# Patient Record
Sex: Male | Born: 1965 | Race: Black or African American | Hispanic: No | Marital: Single | Smoking: Current every day smoker
Health system: Southern US, Community
[De-identification: ages and names within clinical notes are randomized; demographics above are authoritative.]

## PROBLEM LIST (undated history)

## (undated) DIAGNOSIS — E739 Lactose intolerance, unspecified: Secondary | ICD-10-CM

## (undated) DIAGNOSIS — S2239XA Fracture of one rib, unspecified side, initial encounter for closed fracture: Secondary | ICD-10-CM

## (undated) DIAGNOSIS — K859 Acute pancreatitis without necrosis or infection, unspecified: Secondary | ICD-10-CM

## (undated) DIAGNOSIS — R569 Unspecified convulsions: Secondary | ICD-10-CM

## (undated) HISTORY — PX: HAND SURGERY: SHX662

---

## 2006-01-05 ENCOUNTER — Emergency Department (HOSPITAL_COMMUNITY): Admission: EM | Admit: 2006-01-05 | Discharge: 2006-01-05 | Payer: Self-pay | Admitting: Emergency Medicine

## 2006-01-07 ENCOUNTER — Emergency Department (HOSPITAL_COMMUNITY): Admission: EM | Admit: 2006-01-07 | Discharge: 2006-01-07 | Payer: Self-pay | Admitting: Emergency Medicine

## 2015-06-21 ENCOUNTER — Emergency Department (HOSPITAL_COMMUNITY): Payer: Self-pay

## 2015-06-21 ENCOUNTER — Emergency Department (HOSPITAL_COMMUNITY)
Admission: EM | Admit: 2015-06-21 | Discharge: 2015-06-21 | Disposition: A | Discharge status: Home / Self Care | Payer: Self-pay | Attending: Emergency Medicine | Admitting: Emergency Medicine

## 2015-06-21 ENCOUNTER — Encounter (HOSPITAL_COMMUNITY): Payer: Self-pay | Admitting: Emergency Medicine

## 2015-06-21 DIAGNOSIS — K759 Inflammatory liver disease, unspecified: Secondary | ICD-10-CM | POA: Insufficient documentation

## 2015-06-21 LAB — COMPREHENSIVE METABOLIC PANEL
ALT: 137 U/L — ABNORMAL HIGH (ref 17–63)
ANION GAP: 11 (ref 5–15)
AST: 163 U/L — ABNORMAL HIGH (ref 15–41)
Albumin: 4.2 g/dL (ref 3.5–5.0)
Alkaline Phosphatase: 49 U/L (ref 38–126)
BUN: <5 mg/dL — ABNORMAL LOW (ref 6–20)
CALCIUM: 9.2 mg/dL (ref 8.9–10.3)
CHLORIDE: 103 mmol/L (ref 101–111)
CO2: 26 mmol/L (ref 22–32)
Creatinine, Ser: 1.01 mg/dL (ref 0.61–1.24)
GFR calc non Af Amer: >60 mL/min (ref >60)
Glucose, Bld: 91 mg/dL (ref 65–99)
Potassium: 4 mmol/L (ref 3.5–5.1)
SODIUM: 140 mmol/L (ref 135–145)
Total Bilirubin: 1.3 mg/dL — ABNORMAL HIGH (ref 0.3–1.2)
Total Protein: 7.3 g/dL (ref 6.5–8.1)

## 2015-06-21 LAB — URINALYSIS, ROUTINE W REFLEX MICROSCOPIC
Bilirubin Urine: NEGATIVE
GLUCOSE, UA: NEGATIVE mg/dL
Hgb urine dipstick: NEGATIVE
Ketones, ur: NEGATIVE mg/dL
LEUKOCYTES UA: NEGATIVE
NITRITE: NEGATIVE
PROTEIN: NEGATIVE mg/dL
Specific Gravity, Urine: 1.02 (ref 1.005–1.030)
Urobilinogen, UA: 1 mg/dL (ref 0.0–1.0)
pH: 7 (ref 5.0–8.0)

## 2015-06-21 LAB — CBC WITH DIFFERENTIAL/PLATELET
Basophils Absolute: 0 10*3/uL (ref 0.0–0.1)
Basophils Relative: 1 % (ref 0–1)
EOS ABS: 0 10*3/uL (ref 0.0–0.7)
Eosinophils Relative: 0 % (ref 0–5)
HCT: 40.2 % (ref 39.0–52.0)
HEMOGLOBIN: 13.7 g/dL (ref 13.0–17.0)
LYMPHS ABS: 0.8 10*3/uL (ref 0.7–4.0)
LYMPHS PCT: 25 % (ref 12–46)
MCH: 28.7 pg (ref 26.0–34.0)
MCHC: 34.1 g/dL (ref 30.0–36.0)
MCV: 84.1 fL (ref 78.0–100.0)
Monocytes Absolute: 0.5 10*3/uL (ref 0.1–1.0)
Monocytes Relative: 17 % — ABNORMAL HIGH (ref 3–12)
NEUTROS PCT: 57 % (ref 43–77)
Neutro Abs: 1.8 10*3/uL (ref 1.7–7.7)
Platelets: 208 10*3/uL (ref 150–400)
RBC: 4.78 MIL/uL (ref 4.22–5.81)
RDW: 13.4 % (ref 11.5–15.5)
WBC: 3.1 10*3/uL — AB (ref 4.0–10.5)

## 2015-06-21 LAB — ACETAMINOPHEN LEVEL

## 2015-06-21 LAB — LIPASE, BLOOD: Lipase: 26 U/L (ref 22–51)

## 2015-06-21 MED ORDER — SODIUM CHLORIDE 0.9 % IV BOLUS (SEPSIS)
1000.0000 mL | Freq: Once | INTRAVENOUS | Status: AC
  Administered 2015-06-21: 1000 mL via INTRAVENOUS

## 2015-06-21 MED ORDER — PANTOPRAZOLE SODIUM 40 MG IV SOLR
40.0000 mg | Freq: Once | INTRAVENOUS | Status: AC
  Administered 2015-06-21: 40 mg via INTRAVENOUS
  Filled 2015-06-21: qty 40

## 2015-06-21 MED ORDER — ONDANSETRON HCL 4 MG/2ML IJ SOLN
4.0000 mg | Freq: Once | INTRAMUSCULAR | Status: AC
  Administered 2015-06-21: 4 mg via INTRAVENOUS
  Filled 2015-06-21: qty 2

## 2015-06-21 MED ORDER — ONDANSETRON 4 MG PO TBDP: ORAL_TABLET | ORAL | Status: DC

## 2015-06-21 MED ORDER — OMEPRAZOLE 20 MG PO CPDR: 20.0000 mg | DELAYED_RELEASE_CAPSULE | Freq: Every day | ORAL | Status: DC

## 2015-06-21 NOTE — ED Notes (Signed)
Patient transported to Ultrasound 

## 2015-06-21 NOTE — ED Notes (Signed)
Pt states he has vomiting since last Tuesday and has gotten progressively worse. Pt states he does have a mild discomfort in his upper abd that is worse when vomiting.

## 2015-06-21 NOTE — ED Notes (Signed)
Pt tolerating PO well at this time.  

## 2015-06-21 NOTE — ED Provider Notes (Signed)
CSN: [id number]     Arrival date & time 06/21/15  0901 History   First MD Initiated Contact with Patient 06/21/15 [phone]     Chief Complaint  Patient presents with  . Emesis     (Consider location/radiation/quality/duration/timing/severity/associated sxs/prior Treatment) HPI Comments: Patient presents with nausea and vomiting. He states he's had about a one-week history of nausea vomiting. His emesis is nonbloody and nonbilious. He did have one episode of some blood-tinged emesis yesterday but none since that time. He is having normal bowel movements. His last bowel movement was yesterday. No diarrhea. No fevers. He has some discomfort across his upper abdomen but he feels like it's related to the vomiting. He states prior to this starting, he did drink a lot of beer. He states he doesn't currently drink a lot but he was off work and for 2 days drink a fair amount appear. It started subsequent to that. He's had some similar symptoms though not this bad intermittently, sometimes after he drinks alcohol. He denies abdominal surgeries. He denies any blood in his stool. He denies any urinary symptoms.  Patient is a 49 y.o. male presenting with vomiting.  Emesis Associated symptoms: abdominal pain   Associated symptoms: no arthralgias, no chills, no diarrhea and no headaches     History reviewed. No pertinent past medical history. Past Surgical History  Procedure Laterality Date  . Hand surgery     No family history on file. Social History  Substance Use Topics  . Smoking status: Never Smoker   . Smokeless tobacco: None  . Alcohol Use: Yes     Comment: socailly     Review of Systems  Constitutional: Negative for fever, chills, diaphoresis and fatigue.  HENT: Negative for congestion, rhinorrhea and sneezing.   Eyes: Negative.   Respiratory: Negative for cough, chest tightness and shortness of breath.   Cardiovascular: Negative for chest pain and leg swelling.  Gastrointestinal: Positive  for nausea, vomiting and abdominal pain. Negative for diarrhea and blood in stool.  Genitourinary: Negative for frequency, hematuria, flank pain and difficulty urinating.  Musculoskeletal: Negative for back pain and arthralgias.  Skin: Negative for rash.  Neurological: Negative for dizziness, speech difficulty, weakness, numbness and headaches.      Allergies  Lactose intolerance (gi)  Home Medications   Prior to Admission medications   Medication Sig Start Date End Date Taking? Authorizing Provider  acetaminophen (TYLENOL) 325 MG tablet Take 650 mg by mouth every 6 (six) hours as needed for mild pain.   Yes Historical Provider, MD  omeprazole (PRILOSEC) 20 MG capsule Take 1 capsule (20 mg total) by mouth daily. 06/21/15   Rolan Bucco, MD  ondansetron (ZOFRAN ODT) 4 MG disintegrating tablet 4mg  ODT q4 hours prn nausea/vomit 06/21/15   Rolan Bucco, MD   BP 127/77 mmHg  Pulse 62  Temp(Src) 98.6 F (37 C) (Oral)  Resp 16  Ht 6\' 1"  (1.854 m)  Wt 170 lb (77.111 kg)  BMI 22.43 kg/m2  SpO2 99% Physical Exam  Constitutional: He is oriented to person, place, and time. He appears well-developed and well-nourished.  HENT:  Head: Normocephalic and atraumatic.  Eyes: Pupils are equal, round, and reactive to light.  Neck: Normal range of motion. Neck supple.  Cardiovascular: Normal rate, regular rhythm and normal heart sounds.   Pulmonary/Chest: Effort normal and breath sounds normal. No respiratory distress. He has no wheezes. He has no rales. He exhibits no tenderness.  Abdominal: Soft. Bowel sounds are normal. There is tenderness (  Moderate tenderness to the epigastrium and right upper quadrant). There is no rebound and no guarding.  Musculoskeletal: Normal range of motion. He exhibits no edema.  Lymphadenopathy:    He has no cervical adenopathy.  Neurological: He is alert and oriented to person, place, and time.  Skin: Skin is warm and dry. No rash noted.  Psychiatric: He has a normal  mood and affect.    ED Course  Procedures (including critical care time) Labs Review Labs Reviewed  COMPREHENSIVE METABOLIC PANEL - Abnormal; Notable for the following:    BUN <5 (*)    AST 163 (*)    ALT 137 (*)    Total Bilirubin 1.3 (*)    All other components within normal limits  CBC WITH DIFFERENTIAL/PLATELET - Abnormal; Notable for the following:    WBC 3.1 (*)    Monocytes Relative 17 (*)    All other components within normal limits  ACETAMINOPHEN LEVEL - Abnormal; Notable for the following:    Acetaminophen (Tylenol), Serum <10 (*)    All other components within normal limits  LIPASE, BLOOD  URINALYSIS, ROUTINE W REFLEX MICROSCOPIC (NOT AT [facility])  HEPATITIS PANEL, ACUTE    Imaging Review US Abdomen Complete  06/21/2015   CLINICAL DATA:  49 year old male with upper abdominal pain, nausea and vomiting for the past week. Accompanied by elevated LFTs and bilirubin.  EXAM: ULTRASOUND ABDOMEN COMPLETE  COMPARISON:  Prior abdominal ultrasound 01/05/2006  FINDINGS: Gallbladder: No gallstones or wall thickening visualized. No sonographic Murphy sign noted.  Common bile duct: Diameter: Within normal limits at 5 mm  Liver: No focal lesion identified. Perhaps minimally increased echogenicity of the hepatic parenchyma although this is questionable. Main portal vein is patent with normal hepatopetal flow.  IVC: No abnormality visualized.  Pancreas: Visualized portion unremarkable.  Spleen: Size and appearance within normal limits. Small adjacent splenule.  Right Kidney: Length: 10.1 cm. Echogenicity within normal limits. No mass or hydronephrosis visualized.  Left Kidney: Length: 10.7 cm. Echogenicity within normal limits. No mass or hydronephrosis visualized.  Abdominal aorta: No aneurysm visualized.  Other findings: None.  IMPRESSION: 1. No evidence of acute abnormality. Specifically, no evidence of cholelithiasis or biliary ductal dilatation. 2. Perhaps minimally increased echogenicity of the  hepatic parenchyma. Findings are nonspecific but can be seen in the setting of mild steatosis or intrinsic liver disease.   Electronically Signed   By: Malachy Moan M.D.   On: 06/21/2015 11:04   I have personally reviewed and evaluated these images and lab results as part of my medical decision-making.   EKG Interpretation None      MDM   Final diagnoses:  Hepatitis    Patient presents with persistent vomiting. He was given IV fluids and Zofran as well as Protonix in the ED and feels much better. He's tolerating by mouth fluids. He does have an elevation in his liver enzymes. There is no evidence of pancreatitis. There is no evidence of gallbladder disease. He does have some inflammatory changes in his liver parenchyma. I discussed these findings with him. He does state he drinks about 12 beers a week. He uses about 2 BC powders a week but no other Tylenol use. His Tylenol level is negative. I did order a viral hepatitis panel. I feel that his elevated LFTs are more likely resulting from his alcohol use. I had a strong discussion with him about refraining from alcohol use. I advised him to stay away from any products containing Tylenol. I encouraged him  to have close follow-up with the PCP. I gave him a referral to the [facility]. They will need to follow-up on his LFTs. Return precautions were given.    Rolan Bucco, MD 06/21/15 [phone]

## 2015-06-23 LAB — HEPATITIS PANEL, ACUTE
HCV Ab: <0.1 s/co ratio (ref 0.0–0.9)
HEP A IGM: NEGATIVE
HEP B C IGM: NEGATIVE
HEP B S AG: NEGATIVE

## 2015-08-11 ENCOUNTER — Emergency Department (HOSPITAL_COMMUNITY)
Admission: EM | Admit: 2015-08-11 | Discharge: 2015-08-11 | Disposition: A | Discharge status: Home / Self Care | Payer: Self-pay | Attending: Physician Assistant | Admitting: Physician Assistant

## 2015-08-11 ENCOUNTER — Encounter (HOSPITAL_COMMUNITY): Payer: Self-pay | Admitting: Emergency Medicine

## 2015-08-11 DIAGNOSIS — R112 Nausea with vomiting, unspecified: Secondary | ICD-10-CM

## 2015-08-11 DIAGNOSIS — Z720 Tobacco use: Secondary | ICD-10-CM | POA: Insufficient documentation

## 2015-08-11 DIAGNOSIS — K859 Acute pancreatitis without necrosis or infection, unspecified: Secondary | ICD-10-CM | POA: Insufficient documentation

## 2015-08-11 DIAGNOSIS — R197 Diarrhea, unspecified: Secondary | ICD-10-CM

## 2015-08-11 LAB — COMPREHENSIVE METABOLIC PANEL
ALT: 167 U/L — ABNORMAL HIGH (ref 17–63)
ANION GAP: 16 — AB (ref 5–15)
AST: 254 U/L — ABNORMAL HIGH (ref 15–41)
Albumin: 4 g/dL (ref 3.5–5.0)
Alkaline Phosphatase: 67 U/L (ref 38–126)
BILIRUBIN TOTAL: 1.1 mg/dL (ref 0.3–1.2)
BUN: 6 mg/dL (ref 6–20)
CHLORIDE: 97 mmol/L — AB (ref 101–111)
CO2: 19 mmol/L — ABNORMAL LOW (ref 22–32)
Calcium: 9.2 mg/dL (ref 8.9–10.3)
Creatinine, Ser: 0.87 mg/dL (ref 0.61–1.24)
Glucose, Bld: 168 mg/dL — ABNORMAL HIGH (ref 65–99)
POTASSIUM: 4 mmol/L (ref 3.5–5.1)
Sodium: 132 mmol/L — ABNORMAL LOW (ref 135–145)
TOTAL PROTEIN: 7.4 g/dL (ref 6.5–8.1)

## 2015-08-11 LAB — CBC
HCT: 40.9 % (ref 39.0–52.0)
Hemoglobin: 14.2 g/dL (ref 13.0–17.0)
MCH: 29.5 pg (ref 26.0–34.0)
MCHC: 34.7 g/dL (ref 30.0–36.0)
MCV: 84.9 fL (ref 78.0–100.0)
PLATELETS: 194 10*3/uL (ref 150–400)
RBC: 4.82 MIL/uL (ref 4.22–5.81)
RDW: 13.8 % (ref 11.5–15.5)
WBC: 3.1 10*3/uL — AB (ref 4.0–10.5)

## 2015-08-11 LAB — LIPASE, BLOOD: LIPASE: 111 U/L — AB (ref 11–51)

## 2015-08-11 MED ORDER — SODIUM CHLORIDE 0.9 % IV BOLUS (SEPSIS)
1000.0000 mL | Freq: Once | INTRAVENOUS | Status: AC
  Administered 2015-08-11: 1000 mL via INTRAVENOUS

## 2015-08-11 MED ORDER — ONDANSETRON 4 MG PO TBDP: 4.0000 mg | ORAL_TABLET | Freq: Three times a day (TID) | ORAL | Status: DC | PRN

## 2015-08-11 MED ORDER — OMEPRAZOLE 20 MG PO CPDR: 20.0000 mg | DELAYED_RELEASE_CAPSULE | Freq: Every day | ORAL | Status: DC

## 2015-08-11 MED ORDER — ONDANSETRON HCL 4 MG/2ML IJ SOLN
4.0000 mg | Freq: Once | INTRAMUSCULAR | Status: AC
  Administered 2015-08-11: 4 mg via INTRAVENOUS
  Filled 2015-08-11: qty 2

## 2015-08-11 NOTE — ED Provider Notes (Signed)
CSN: [id number]     Arrival date & time 08/11/15  1239 History   First MD Initiated Contact with Patient 08/11/15 1504     Chief Complaint  Patient presents with  . Emesis  . Diarrhea   Luis Brooks is a 49 y.o. male with a history of alcohol use who presents to the emergency department complaining of nausea, vomiting, diarrhea and abdominal pain for the past 3 days. He reports he's been having vomiting for the past 3 days and has vomited once today. He reports he's had diarrhea since yesterday and only one episode of diarrhea today. He reports that after arrival to the emergency department his abdominal pain has resolved. He reports just some slight nausea currently. He reports he drinks alcohol daily and drinks 3 beers a day which he has been reducing. He denies history of abdominal surgeries. He denies fevers, chills, hematemesis, hematochezia, urinary symptoms, cough, chest pain, syncope, or rashes.  The patient was seen in the emergency department on 06/21/2015 and had a negative hepatitis panel and an unremarkable abdominal ultrasound.   (Consider location/radiation/quality/duration/timing/severity/associated sxs/prior Treatment) HPI  History reviewed. No pertinent past medical history. Past Surgical History  Procedure Laterality Date  . Hand surgery     History reviewed. No pertinent family history. Social History  Substance Use Topics  . Smoking status: Current Every Day Smoker  . Smokeless tobacco: None  . Alcohol Use: Yes     Comment: socailly     Review of Systems  Constitutional: Negative for fever and chills.  HENT: Negative for congestion and sore throat.   Eyes: Negative for visual disturbance.  Respiratory: Negative for cough and shortness of breath.   Cardiovascular: Negative for chest pain.  Gastrointestinal: Positive for nausea, vomiting, abdominal pain and diarrhea. Negative for blood in stool.  Genitourinary: Negative for dysuria, urgency, frequency,  hematuria, decreased urine volume and difficulty urinating.  Musculoskeletal: Negative for back pain and neck pain.  Skin: Negative for rash.  Neurological: Negative for syncope, weakness, light-headedness and headaches.      Allergies  Lactose intolerance (gi)  Home Medications   Prior to Admission medications   Medication Sig Start Date End Date Taking? Authorizing Provider  omeprazole (PRILOSEC) 20 MG capsule Take 1 capsule (20 mg total) by mouth daily. 08/11/15   Everlene FarrierWilliam Tacoya Altizer, PA-C  ondansetron (ZOFRAN ODT) 4 MG disintegrating tablet Take 1 tablet (4 mg total) by mouth every 8 (eight) hours as needed for nausea or vomiting. 08/11/15   Everlene FarrierWilliam Omar Orrego, PA-C   BP 141/86 mmHg  Pulse 65  Temp(Src) 98.6 F (37 C) (Oral)  Resp 18  Ht 6\' 1"  (1.854 m)  Wt 175 lb (79.379 kg)  BMI 23.09 kg/m2  SpO2 100% Physical Exam  Constitutional: He is oriented to person, place, and time. He appears well-developed and well-nourished. No distress.  Nontoxic appearing.  HENT:  Head: Normocephalic and atraumatic.  Mouth/Throat: Oropharynx is clear and moist.  Eyes: Conjunctivae are normal. Pupils are equal, round, and reactive to light. Right eye exhibits no discharge. Left eye exhibits no discharge.  Neck: Neck supple.  Cardiovascular: Normal rate, regular rhythm, normal heart sounds and intact distal pulses.  Exam reveals no gallop and no friction rub.   No murmur heard. Pulmonary/Chest: Effort normal and breath sounds normal. No respiratory distress. He has no wheezes. He has no rales.  Abdominal: Soft. Bowel sounds are normal. He exhibits no distension. There is no tenderness. There is no rebound and no guarding.  Abdomen  is soft and nontender to palpation. Bowel sounds are present. No psoas or obturator sign. No rebound tenderness. No epigastric tenderness to palpation.  Musculoskeletal: He exhibits no edema or tenderness.  Lymphadenopathy:    He has no cervical adenopathy.  Neurological:  He is alert and oriented to person, place, and time. Coordination normal.  Skin: Skin is warm and dry. No rash noted. He is not diaphoretic. No erythema. No pallor.  Psychiatric: He has a normal mood and affect. His behavior is normal.  Nursing note and vitals reviewed.   ED Course  Procedures (including critical care time) Labs Review Labs Reviewed  LIPASE, BLOOD - Abnormal; Notable for the following:    Lipase 111 (*)    All other components within normal limits  COMPREHENSIVE METABOLIC PANEL - Abnormal; Notable for the following:    Sodium 132 (*)    Chloride 97 (*)    CO2 19 (*)    Glucose, Bld 168 (*)    AST 254 (*)    ALT 167 (*)    Anion gap 16 (*)    All other components within normal limits  CBC - Abnormal; Notable for the following:    WBC 3.1 (*)    All other components within normal limits    Imaging Review No results found. I have personally reviewed and evaluated these lab results as part of my medical decision-making.   EKG Interpretation None      Filed Vitals:   08/11/15 1645 08/11/15 1715 08/11/15 1745 08/11/15 1918  BP: 133/82 134/79 134/87 141/86  Pulse:    65  Temp:    98.6 F (37 C)  TempSrc:    Oral  Resp:    18  Height:      Weight:      SpO2:    100%     MDM   Meds given in ED:  Medications  sodium chloride 0.9 % bolus 1,000 mL (0 mLs Intravenous Stopped 08/11/15 1821)  ondansetron (ZOFRAN) injection 4 mg (4 mg Intravenous Given 08/11/15 1631)    Discharge Medication List as of 08/11/2015  7:55 PM      Final diagnoses:  Acute pancreatitis, unspecified pancreatitis type  Nausea vomiting and diarrhea   This is a 49 y.o. male with a history of alcohol use who presents to the emergency department complaining of nausea, vomiting, diarrhea and abdominal pain for the past 3 days. He reports he's been having vomiting for the past 3 days and has vomited once today. He reports he's had diarrhea since yesterday and only one episode of  diarrhea today. He reports that after arrival to the emergency department his abdominal pain has resolved. He reports just some slight nausea currently. He reports he drinks alcohol daily and drinks 3 beers a day which he has been reducing.  On exam of patient's afebrile nontoxic appearing. His abdomen is soft and nontender to palpation. No leukocytosis on CBC. CMP indicates AST of 254 and ALT of 167. He has an anion gap of 16. No elevated out phosphorus or bilirubin. Lipase is elevated at 111. The patient was seen for similar symptoms on 06/21/15 and had an unremarkable abdominal ultrasound as well as negative hepatitis panel.  Today patient given fluid bolus and Zofran. Patient tolerated crackers, a veteran ginger ale prior to discharge. He reports he is still pain free prior to discharge. He denies any nausea. He reports he is ready for discharge. We'll discharge with prescriptions for Zofran and omeprazole. I  encouraged close follow-up advised provider. I advised the patient to follow-up with their primary care provider this week. I advised the patient to return to the emergency department with new or worsening symptoms or new concerns. The patient verbalized understanding and agreement with plan.    This patient was discussed with Dr. Corlis Leak who agrees with assessment and plan.    Everlene Farrier, PA-C 08/12/15 1435  Courteney Randall An, MD 08/12/15 2256

## 2015-08-11 NOTE — Discharge Instructions (Signed)
Acute Pancreatitis Acute pancreatitis is a disease in which the pancreas becomes suddenly inflamed. The pancreas is a large gland located behind your stomach. The pancreas produces enzymes that help digest food. The pancreas also releases the hormones glucagon and insulin that help regulate blood sugar. Damage to the pancreas occurs when the digestive enzymes from the pancreas are activated and begin attacking the pancreas before being released into the intestine. Most acute attacks last a couple of days and can cause serious complications. Some people become dehydrated and develop low blood pressure. In severe cases, bleeding into the pancreas can lead to shock and can be life-threatening. The lungs, heart, and kidneys may fail. CAUSES  Pancreatitis can happen to anyone. In some cases, the cause is unknown. Most cases are caused by:  Alcohol abuse.  Gallstones. Other less common causes are:  Certain medicines.  Exposure to certain chemicals.  Infection.  Damage caused by an accident (trauma).  Abdominal surgery. SYMPTOMS   Pain in the upper abdomen that may radiate to the back.  Tenderness and swelling of the abdomen.  Nausea and vomiting. DIAGNOSIS  Your caregiver will perform a physical exam. Blood and stool tests may be done to confirm the diagnosis. Imaging tests may also be done, such as X-rays, CT scans, or an ultrasound of the abdomen. TREATMENT  Treatment usually requires a stay in the hospital. Treatment may include:  Pain medicine.  Fluid replacement through an intravenous line (IV).  Placing a tube in the stomach to remove stomach contents and control vomiting.  Not eating for 3 or 4 days. This gives your pancreas a rest, because enzymes are not being produced that can cause further damage.  Antibiotic medicines if your condition is caused by an infection.  Surgery of the pancreas or gallbladder. HOME CARE INSTRUCTIONS   Follow the diet advised by your  caregiver. This may involve avoiding alcohol and decreasing the amount of fat in your diet.  Eat smaller, more frequent meals. This reduces the amount of digestive juices the pancreas produces.  Drink enough fluids to keep your urine clear or pale yellow.  Only take over-the-counter or prescription medicines as directed by your caregiver.  Avoid drinking alcohol if it caused your condition.  Do not smoke.  Get plenty of rest.  Check your blood sugar at home as directed by your caregiver.  Keep all follow-up appointments as directed by your caregiver. SEEK MEDICAL CARE IF:   You do not recover as quickly as expected.  You develop new or worsening symptoms.  You have persistent pain, weakness, or nausea.  You recover and then have another episode of pain. SEEK IMMEDIATE MEDICAL CARE IF:   You are unable to eat or keep fluids down.  Your pain becomes severe.  You have a fever or persistent symptoms for more than 2 to 3 days.  You have a fever and your symptoms suddenly get worse.  Your skin or the white part of your eyes turn yellow (jaundice).  You develop vomiting.  You feel dizzy, or you faint.  Your blood sugar is high (over 300 mg/dL). MAKE SURE YOU:   Understand these instructions.  Will watch your condition.  Will get help right away if you are not doing well or get worse.   This information is not intended to replace advice given to you by your health care provider. Make sure you discuss any questions you have with your health care provider.   Document Released: 10/02/2005 Document Revised: 04/02/2012  Document Reviewed: 01/11/2012 Elsevier Interactive Patient Education 2016 Else[employer] Inc[employer] Nausea and Vomiting Nausea is a sick feeling that often comes before throwing up (vomiting). Vomiting is a reflex where stomach contents come out of your mouth. Vomiting can cause severe loss of body fluids (dehydration). Children and elderly adults can become dehydrated  quickly, especially if they also have diarrhea. Nausea and vomiting are symptoms of a condition or disease. It is important to find the cause of your symptoms. CAUSES   Direct irritation of the stomach lining. This irritation can result from increased acid production (gastroesophageal reflux disease), infection, food poisoning, taking certain medicines (such as nonsteroidal anti-inflammatory drugs), alcohol use, or tobacco use.  Signals from the brain.These signals could be caused by a headache, heat exposure, an inner ear disturbance, increased pressure in the brain from injury, infection, a tumor, or a concussion, pain, emotional stimulus, or metabolic problems.  An obstruction in the gastrointestinal tract (bowel obstruction).  Illnesses such as diabetes, hepatitis, gallbladder problems, appendicitis, kidney problems, cancer, sepsis, atypical symptoms of a heart attack, or eating disorders.  Medical treatments such as chemotherapy and radiation.  Receiving medicine that makes you sleep (general anesthetic) during surgery. DIAGNOSIS Your caregiver may ask for tests to be done if the problems do not improve after a few days. Tests may also be done if symptoms are severe or if the reason for the nausea and vomiting is not clear. Tests may include:  Urine tests.  Blood tests.  Stool tests.  Cultures (to look for evidence of infection).  X-rays or other imaging studies. Test results can help your caregiver make decisions about treatment or the need for additional tests. TREATMENT You need to stay well hydrated. Drink frequently but in small amounts.You may wish to drink water, sports drinks, clear broth, or eat frozen ice pops or gelatin dessert to help stay hydrated.When you eat, eating slowly may help prevent nausea.There are also some antinausea medicines that may help prevent nausea. HOME CARE INSTRUCTIONS   Take all medicine as directed by your caregiver.  If you do not have  an appetite, do not force yourself to eat. However, you must continue to drink fluids.  If you have an appetite, eat a normal diet unless your caregiver tells you differently.  Eat a variety of complex carbohydrates (rice, wheat, potatoes, bread), lean meats, yogurt, fruits, and vegetables.  Avoid high-fat foods because they are more difficult to digest.  Drink enough water and fluids to keep your urine clear or pale yellow.  If you are dehydrated, ask your caregiver for specific rehydration instructions. Signs of dehydration may include:  Severe thirst.  Dry lips and mouth.  Dizziness.  Dark urine.  Decreasing urine frequency and amount.  Confusion.  Rapid breathing or pulse. SEEK IMMEDIATE MEDICAL CARE IF:   You have blood or brown flecks (like coffee grounds) in your vomit.  You have black or bloody stools.  You have a severe headache or stiff neck.  You are confused.  You have severe abdominal pain.  You have chest pain or trouble breathing.  You do not urinate at least once every 8 hours.  You develop cold or clammy skin.  You continue to vomit for longer than 24 to 48 hours.  You have a fever. MAKE SURE YOU:   Understand these instructions.  Will watch your condition.  Will get help right away if you are not doing well or get worse.   This information is not intended to replace  advice given to you by your health care provider. Make sure you discuss any questions you have with your health care provider.   Document Released: 10/02/2005 Document Revised: 12/25/2011 Document Reviewed: 03/01/2011 Elsevier Interactive Patient Education 2016 Elsevier Inc. Diarrhea Diarrhea is frequent loose and watery bowel movements. It can cause you to feel weak and dehydrated. Dehydration can cause you to become tired and thirsty, have a dry mouth, and have decreased urination that often is dark yellow. Diarrhea is a sign of another problem, most often an infection that  will not last long. In most cases, diarrhea typically lasts 2-3 days. However, it can last longer if it is a sign of something more serious. It is important to treat your diarrhea as directed by your caregiver to lessen or prevent future episodes of diarrhea. CAUSES  Some common causes include:  Gastrointestinal infections caused by viruses, bacteria, or parasites.  Food poisoning or food allergies.  Certain medicines, such as antibiotics, chemotherapy, and laxatives.  Artificial sweeteners and fructose.  Digestive disorders. HOME CARE INSTRUCTIONS  Ensure adequate fluid intake (hydration): Have 1 cup (8 oz) of fluid for each diarrhea episode. Avoid fluids that contain simple sugars or sports drinks, fruit juices, whole milk products, and sodas. Your urine should be clear or pale yellow if you are drinking enough fluids. Hydrate with an oral rehydration solution that you can purchase at pharmacies, retail stores, and online. You can prepare an oral rehydration solution at home by mixing the following ingredients together:   - tsp table salt.   tsp baking soda.   tsp salt substitute containing potassium chloride.  1  tablespoons sugar.  1 L (34 oz) of water.  Certain foods and beverages may increase the speed at which food moves through the gastrointestinal (GI) tract. These foods and beverages should be avoided and include:  Caffeinated and alcoholic beverages.  High-fiber foods, such as raw fruits and vegetables, nuts, seeds, and whole grain breads and cereals.  Foods and beverages sweetened with sugar alcohols, such as xylitol, sorbitol, and mannitol.  Some foods may be well tolerated and may help thicken stool including:  Starchy foods, such as rice, toast, pasta, low-sugar cereal, oatmeal, grits, baked potatoes, crackers, and bagels.  Bananas.  Applesauce.  Add probiotic-rich foods to help increase healthy bacteria in the GI tract, such as yogurt and fermented milk  products.  Wash your hands well after each diarrhea episode.  Only take over-the-counter or prescription medicines as directed by your caregiver.  Take a warm bath to relieve any burning or pain from frequent diarrhea episodes. SEEK IMMEDIATE MEDICAL CARE IF:   You are unable to keep fluids down.  You have persistent vomiting.  You have blood in your stool, or your stools are black and tarry.  You do not urinate in 6-8 hours, or there is only a small amount of very dark urine.  You have abdominal pain that increases or localizes.  You have weakness, dizziness, confusion, or light-headedness.  You have a severe headache.  Your diarrhea gets worse or does not get better.  You have a fever or persistent symptoms for more than 2-3 days.  You have a fever and your symptoms suddenly get worse. MAKE SURE YOU:   Understand these instructions.  Will watch your condition.  Will get help right away if you are not doing well or get worse.   This information is not intended to replace advice given to you by your health care provider. Make sure  you discuss any questions you have with your health care provider.   Document Released: 09/22/2002 Document Revised: 10/23/2014 Document Reviewed: 06/09/2012 Elsevier Interactive Patient Education [employer]

## 2015-08-11 NOTE — ED Notes (Signed)
Pt states he has all of his belongings with him at d/c 

## 2015-08-11 NOTE — ED Notes (Signed)
Pt sts N/V/D x 3 days; pt sts admitted for same in past

## 2015-08-11 NOTE — ED Notes (Signed)
Pt stating he would like to hold off on the fluids at this time and only wants a prescription for zofran and omeprazole. Will, PA informed.

## 2015-08-11 NOTE — ED Notes (Signed)
Pt A&OX4, ambulatory at d/c with steady gait, NAD 

## 2015-08-11 NOTE — ED Notes (Signed)
Pt reports nausea/vomiting/diarrhea and generalized abdominal pain. 8/10 pain upon arrival to ED. Abdomen soft and non tender to palpation.

## 2015-08-11 NOTE — ED Notes (Signed)
Will, PA at bedside. 

## 2015-12-21 ENCOUNTER — Encounter (HOSPITAL_COMMUNITY): Payer: Self-pay | Admitting: Emergency Medicine

## 2015-12-21 ENCOUNTER — Emergency Department (HOSPITAL_COMMUNITY)
Admission: EM | Admit: 2015-12-21 | Discharge: 2015-12-21 | Disposition: A | Discharge status: Home / Self Care | Payer: Self-pay | Attending: Emergency Medicine | Admitting: Emergency Medicine

## 2015-12-21 ENCOUNTER — Emergency Department (HOSPITAL_COMMUNITY): Payer: Self-pay

## 2015-12-21 DIAGNOSIS — Z79899 Other long term (current) drug therapy: Secondary | ICD-10-CM | POA: Insufficient documentation

## 2015-12-21 DIAGNOSIS — R569 Unspecified convulsions: Secondary | ICD-10-CM | POA: Insufficient documentation

## 2015-12-21 DIAGNOSIS — F17200 Nicotine dependence, unspecified, uncomplicated: Secondary | ICD-10-CM | POA: Insufficient documentation

## 2015-12-21 DIAGNOSIS — F1210 Cannabis abuse, uncomplicated: Secondary | ICD-10-CM | POA: Insufficient documentation

## 2015-12-21 DIAGNOSIS — Z7982 Long term (current) use of aspirin: Secondary | ICD-10-CM | POA: Insufficient documentation

## 2015-12-21 LAB — COMPREHENSIVE METABOLIC PANEL
ALBUMIN: 4.1 g/dL (ref 3.5–5.0)
AST: 108 U/L — AB (ref 15–41)
Alkaline Phosphatase: 56 U/L (ref 38–126)
Anion gap: 23 — ABNORMAL HIGH (ref 5–15)
BUN: 12 mg/dL (ref 6–20)
CHLORIDE: 104 mmol/L (ref 101–111)
CO2: 16 mmol/L — AB (ref 22–32)
CREATININE: 1.53 mg/dL — AB (ref 0.61–1.24)
Calcium: 9.6 mg/dL (ref 8.9–10.3)
GFR calc Af Amer: >60 mL/min (ref >60)
GFR calc non Af Amer: 53 mL/min — ABNORMAL LOW (ref >60)
GLUCOSE: 122 mg/dL — AB (ref 65–99)
Potassium: 4.5 mmol/L (ref 3.5–5.1)
SODIUM: 143 mmol/L (ref 135–145)
Total Bilirubin: 0.8 mg/dL (ref 0.3–1.2)
Total Protein: 7.5 g/dL (ref 6.5–8.1)

## 2015-12-21 LAB — I-STAT CHEM 8, ED
BUN: 13 mg/dL (ref 6–20)
CREATININE: 1.1 mg/dL (ref 0.61–1.24)
Calcium, Ion: 1.15 mmol/L (ref 1.12–1.23)
Chloride: 105 mmol/L (ref 101–111)
Glucose, Bld: 114 mg/dL — ABNORMAL HIGH (ref 65–99)
HEMATOCRIT: 45 % (ref 39.0–52.0)
HEMOGLOBIN: 15.3 g/dL (ref 13.0–17.0)
POTASSIUM: 4.2 mmol/L (ref 3.5–5.1)
SODIUM: 140 mmol/L (ref 135–145)
TCO2: 16 mmol/L (ref 0–100)

## 2015-12-21 LAB — CBC WITH DIFFERENTIAL/PLATELET
BASOS ABS: 0 10*3/uL (ref 0.0–0.1)
BASOS PCT: 0 %
Eosinophils Absolute: 0 10*3/uL (ref 0.0–0.7)
Eosinophils Relative: 0 %
HEMATOCRIT: 39.7 % (ref 39.0–52.0)
Hemoglobin: 13.2 g/dL (ref 13.0–17.0)
Lymphocytes Relative: 8 %
Lymphs Abs: 0.8 10*3/uL (ref 0.7–4.0)
MCH: 30.1 pg (ref 26.0–34.0)
MCHC: 33.2 g/dL (ref 30.0–36.0)
MCV: 90.4 fL (ref 78.0–100.0)
MONO ABS: 0.7 10*3/uL (ref 0.1–1.0)
Monocytes Relative: 7 %
NEUTROS ABS: 8.7 10*3/uL — AB (ref 1.7–7.7)
Neutrophils Relative %: 85 %
PLATELETS: 205 10*3/uL (ref 150–400)
RBC: 4.39 MIL/uL (ref 4.22–5.81)
RDW: 14.1 % (ref 11.5–15.5)
WBC: 10.3 10*3/uL (ref 4.0–10.5)

## 2015-12-21 LAB — RAPID URINE DRUG SCREEN, HOSP PERFORMED
AMPHETAMINES: NOT DETECTED
BENZODIAZEPINES: NOT DETECTED
Barbiturates: NOT DETECTED
COCAINE: NOT DETECTED
OPIATES: NOT DETECTED
TETRAHYDROCANNABINOL: POSITIVE — AB

## 2015-12-21 LAB — URINE MICROSCOPIC-ADD ON

## 2015-12-21 LAB — BASIC METABOLIC PANEL
Anion gap: 10 (ref 5–15)
BUN: 13 mg/dL (ref 6–20)
CO2: 25 mmol/L (ref 22–32)
Calcium: 8.8 mg/dL — ABNORMAL LOW (ref 8.9–10.3)
Chloride: 107 mmol/L (ref 101–111)
Creatinine, Ser: 1.1 mg/dL (ref 0.61–1.24)
GFR calc Af Amer: >60 mL/min (ref >60)
GFR calc non Af Amer: >60 mL/min (ref >60)
GLUCOSE: 105 mg/dL — AB (ref 65–99)
Potassium: 4.1 mmol/L (ref 3.5–5.1)
Sodium: 142 mmol/L (ref 135–145)

## 2015-12-21 LAB — URINALYSIS, ROUTINE W REFLEX MICROSCOPIC
Bilirubin Urine: NEGATIVE
Glucose, UA: NEGATIVE mg/dL
Ketones, ur: 15 mg/dL — AB
Leukocytes, UA: NEGATIVE
Nitrite: NEGATIVE
PH: 5 (ref 5.0–8.0)
Protein, ur: 100 mg/dL — AB
SPECIFIC GRAVITY, URINE: 1.023 (ref 1.005–1.030)

## 2015-12-21 LAB — I-STAT CG4 LACTIC ACID, ED: LACTIC ACID, VENOUS: 1.47 mmol/L (ref 0.5–2.0)

## 2015-12-21 LAB — CBG MONITORING, ED: Glucose-Capillary: 127 mg/dL — ABNORMAL HIGH (ref 65–99)

## 2015-12-21 MED ORDER — LEVETIRACETAM 500 MG PO TABS: 500.0000 mg | ORAL_TABLET | Freq: Two times a day (BID) | ORAL | Status: DC

## 2015-12-21 MED ORDER — LEVETIRACETAM 500 MG/5ML IV SOLN
1000.0000 mg | Freq: Once | INTRAVENOUS | Status: AC
  Administered 2015-12-21: 1000 mg via INTRAVENOUS
  Filled 2015-12-21 (×2): qty 10

## 2015-12-21 MED ORDER — SODIUM CHLORIDE 0.9 % IV BOLUS (SEPSIS)
1000.0000 mL | Freq: Once | INTRAVENOUS | Status: AC
Start: 2015-12-21 — End: 2015-12-21
  Administered 2015-12-21: 1000 mL via INTRAVENOUS

## 2015-12-21 MED ORDER — LORAZEPAM 2 MG/ML IJ SOLN
INTRAMUSCULAR | Status: AC
  Administered 2015-12-21: 1 mg
  Filled 2015-12-21: qty 1

## 2015-12-21 NOTE — ED Notes (Signed)
[facility], new onset seizure after potential chemical exposure, was agitated and post-ictal on arrival, calm and alert but disoriented on arrival, VSS, CBG 120

## 2015-12-21 NOTE — ED Notes (Signed)
Pt had approximate 1 min seizure in hall, RNs and MD to bedside, no vomiting, incontinence or oral trauma, 1 mg Ativan given, pt moved to exam room, is post-ictal

## 2015-12-21 NOTE — ED Provider Notes (Signed)
MSE was initiated and I personally evaluated the patient and placed orders (if any) at  2:48 PM on December 21, 2015.  The patient appears stable so that the remainder of the MSE may be completed by another provider.  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 After the initial seizure patient became agitated and post ictal, he was oriented 2 on arrival and had a second brief seizure and was given Ativan. On my exam patient is postictal, nonverbal, confused but cooperative. Pupils equal round reactive to light, moving all extremity, lung sounds clear to auscultation, no tachycardia, abdominal exam is benign. No incontinence.  Wynetta Emeryicole Wilson Sample, PA-C 12/21/15 1449  Glynn OctaveStephen Rancour, MD 12/21/15 [phone]

## 2015-12-21 NOTE — ED Provider Notes (Signed)
CSN: [id number]     Arrival date & time 12/21/15  1429 History   First MD Initiated Contact with Patient 12/21/15 1452     Chief Complaint  Patient presents with  . Seizures   Patient is a 50 y.o. male presenting with seizures. The history is provided by the patient and the EMS personnel. No language interpreter was used.  Seizures Seizure activity on arrival: yes   Seizure type:  Grand mal Preceding symptoms: no sensation of an aura present, no dizziness, no headache, no hyperventilation, no nausea, no numbness, no panic and no vision change   Initial focality:  None Episode characteristics: abnormal movements and combativeness   Postictal symptoms: confusion   Return to baseline: yes   Severity:  Moderate Number of seizures this episode:  1 Progression:  Resolved Context: not change in medication, not drug use, not emotional upset, not fever, not possible hypoglycemia and not stress   Context comment:  Exposure to industrial chemicals Recent head injury:  No recent head injuries PTA treatment:  None History of seizures: no     History reviewed. No pertinent past medical history. Past Surgical History  Procedure Laterality Date  . Hand surgery     No family history on file. Social History  Substance Use Topics  . Smoking status: Current Every Day Smoker  . Smokeless tobacco: None  . Alcohol Use: Yes     Comment: socailly     Review of Systems  Constitutional: Negative for fever, chills, activity change and appetite change.  HENT: Negative for congestion, dental problem, ear pain, facial swelling, hearing loss, rhinorrhea, sneezing, sore throat, trouble swallowing and voice change.   Eyes: Negative for photophobia, pain, redness and visual disturbance.  Respiratory: Negative for apnea, cough, chest tightness, shortness of breath, wheezing and stridor.   Cardiovascular: Negative for chest pain, palpitations and leg swelling.  Gastrointestinal: Negative for nausea, vomiting,  abdominal pain, diarrhea, constipation, blood in stool and abdominal distention.  Endocrine: Negative for polydipsia and polyuria.  Genitourinary: Negative for frequency, hematuria, flank pain, decreased urine volume and difficulty urinating.  Musculoskeletal: Negative for back pain, joint swelling, gait problem, neck pain and neck stiffness.  Skin: Negative for rash and wound.  Allergic/Immunologic: Negative for immunocompromised state.  Neurological: Positive for seizures. Negative for dizziness, syncope, facial asymmetry, speech difficulty, weakness, light-headedness, numbness and headaches.  Hematological: Negative for adenopathy.  Psychiatric/Behavioral: Negative for suicidal ideas, behavioral problems, confusion, sleep disturbance and agitation. The patient is not nervous/anxious.   All other systems reviewed and are negative.     Allergies  Lactose intolerance (gi)  Home Medications   Prior to Admission medications   Medication Sig Start Date End Date Taking? Authorizing Provider  Aspirin-Salicylamide-Caffeine (BC HEADACHE POWDER PO) Take by mouth.   Yes Historical Provider, MD  tetrahydrozoline 0.05 % ophthalmic solution Place 1 drop into both eyes 2 (two) times daily.   Yes Historical Provider, MD  levETIRAcetam (KEPPRA) 500 MG tablet Take 1 tablet (500 mg total) by mouth 2 (two) times daily. 12/21/15 01/04/16  Dan Humphreys, MD   BP 127/86 mmHg  Pulse 66  Temp(Src) 98.1 F (36.7 C) (Oral)  Resp 16  SpO2 100% Physical Exam  Constitutional: He is oriented to person, place, and time. He appears well-developed and well-nourished. No distress.  HENT:  Head: Normocephalic and atraumatic.  Right Ear: External ear normal.  Left Ear: External ear normal.  Eyes: Pupils are equal, round, and reactive to light. Right eye exhibits no  discharge. Left eye exhibits no discharge.  Neck: Normal range of motion. No JVD present. No tracheal deviation present.  Cardiovascular: Normal rate,  regular rhythm and normal heart sounds.  Exam reveals no friction rub.   No murmur heard. Pulmonary/Chest: Effort normal and breath sounds normal. No stridor. No respiratory distress. He has no wheezes.  Abdominal: Soft. Bowel sounds are normal. He exhibits no distension. There is no rebound and no guarding.  Musculoskeletal: Normal range of motion. He exhibits no edema or tenderness.  Lymphadenopathy:    He has no cervical adenopathy.  Neurological: He is alert and oriented to person, place, and time. No cranial nerve deficit. Coordination normal.  Skin: Skin is warm and dry. No rash noted. No pallor.  Psychiatric: He has a normal mood and affect. His behavior is normal. Judgment and thought content normal.  Nursing note and vitals reviewed.   ED Course  Procedures (including critical care time) Labs Review Labs Reviewed  CBC WITH DIFFERENTIAL/PLATELET - Abnormal; Notable for the following:    Neutro Abs 8.7 (*)    All other components within normal limits  COMPREHENSIVE METABOLIC PANEL - Abnormal; Notable for the following:    CO2 16 (*)    Glucose, Bld 122 (*)    Creatinine, Ser 1.53 (*)    AST 108 (*)    ALT <5 (*)    GFR calc non Af Amer 53 (*)    Anion gap 23 (*)    All other components within normal limits  URINALYSIS, ROUTINE W REFLEX MICROSCOPIC (NOT [facility]) - Abnormal; Notable for the following:    Color, Urine AMBER (*)    Hgb urine dipstick SMALL (*)    Ketones, ur 15 (*)    Protein, ur 100 (*)    All other components within normal limits  URINE RAPID DRUG SCREEN, HOSP PERFORMED - Abnormal; Notable for the following:    Tetrahydrocannabinol POSITIVE (*)    All other components within normal limits  URINE MICROSCOPIC-ADD ON - Abnormal; Notable for the following:    Squamous Epithelial / LPF 0-5 (*)    Bacteria, UA RARE (*)    Casts HYALINE CASTS (*)    All other components within normal limits  BASIC METABOLIC PANEL - Abnormal; Notable for the following:     Glucose, Bld 105 (*)    Calcium 8.8 (*)    All other components within normal limits  I-STAT CHEM 8, ED - Abnormal; Notable for the following:    Glucose, Bld 114 (*)    All other components within normal limits  CBG MONITORING, ED - Abnormal; Notable for the following:    Glucose-Capillary 127 (*)    All other components within normal limits  I-STAT CG4 LACTIC ACID, ED    Imaging Review Ct Head Wo Contrast  12/21/2015  CLINICAL DATA:  New onset of seizure, possible reaction to industrial glue EXAM: CT HEAD WITHOUT CONTRAST TECHNIQUE: Contiguous axial images were obtained from the base of the skull through the vertex without intravenous contrast. COMPARISON:  None. FINDINGS: No skull fracture is noted. No intracranial hemorrhage, mass effect or midline shift. No acute cortical infarction. No mass lesion is noted on this unenhanced scan. No hydrocephalus. No intra or extra-axial fluid collection. The gray and white-matter differentiation is preserved. IMPRESSION: No acute intracranial abnormality Electronically Signed   By: Natasha MeadLiviu  Pop M.D.   On: 12/21/2015 16:33   I have personally reviewed and evaluated these images and lab results as part of  my medical decision-making.   EKG Interpretation   Date/Time:  Tuesday December 21 2015 14:42:30 EST Ventricular Rate:  104 PR Interval:  138 QRS Duration: 101 QT Interval:  357 QTC Calculation: 470 R Axis:   82 Text Interpretation:  Sinus tachycardia RSR' in V1 or V2, probably normal  variant No previous ECGs available Confirmed by Manus Gunning  MD, STEPHEN  [phone]) on 12/21/2015 2:48:56 PM      MDM   Final diagnoses:  Seizure [facility])   Patient with no significant past medical history presents via EMS for evaluation of seizure-like activity. He's never had a seizure before. 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 Upon arrival to emergency department patient postictal with disorientation. Blood sugar 127. Shortly after  arrival patient had a roughly 1 minute seizure in the hall.  He was given 1 mg Ativan en route to the room. Upon my evaluation patient GCS 15, alert and oriented 3. From EMS and nursing notes, it does not appear that patient ever fully return his baseline between events.  Vital signs stable upon arrival. No tongue biting, no incontinence. Patient unsure of chemical that he was exposed to. He is calling his friend to get this information for [place].   Will check head CT, basic labs. Seizure precautions while in ED.    4:34 PM: Patient able to contact friend. He was exposed to [employer] adhesive remover. MSDS reviewed, ingredients ethanolamine and 2-butoxyethanol.  CT head with no acute findings. Blood glucose 127. Initial CMP with anion gap metabolic acidosis. This is likely due to elevated lactate from seizure activity. Patient hydrated, on recheck anion gap is closed and acidosis has resolved. I suspect seizure was likely caused by accidental inhalation of commercial adhesive remover.    I discussed case with neurologist on call. Due to potential gap between seizures, he recommended giving Keppra load, Keppra 500 twice a day. He is encouraged to follow-up with neurology on an outpatient basis. He was given strict seizure precautions including no driving, no operating heavy machinery, no danger situations until cleared by neurologist. He voiced understanding and agreement.   Discussed case with my attending, Dr. Preston Fleeting.   Dan Humphreys, MD 12/22/15 6045  Dione Booze, MD 12/22/15 2236

## 2015-12-21 NOTE — ED Notes (Signed)
Pt's CBG result was 127. Informed Ben - RN and Shanda BumpsJessica - RN.

## 2015-12-21 NOTE — Discharge Instructions (Signed)
Take Keppra as prescribed.  Do not drive or put herself in any danger situations until you have clearance by neurologist.    Seizure, Adult A seizure is abnormal electrical activity in the brain. Seizures usually last from 30 seconds to 2 minutes. There are various types of seizures. Before a seizure, you may have a warning sensation (aura) that a seizure is about to occur. An aura may include the following symptoms:   Fear or anxiety.  Nausea.  Feeling like the room is spinning (vertigo).  Vision changes, such as seeing flashing lights or spots. Common symptoms during a seizure include:  A change in attention or behavior (altered mental status).  Convulsions with rhythmic jerking movements.  Drooling.  Rapid eye movements.  Grunting.  Loss of bladder and bowel control.  Bitter taste in the mouth.  Tongue biting. After a seizure, you may feel confused and sleepy. You may also have an injury resulting from convulsions during the seizure. HOME CARE INSTRUCTIONS   If you are given medicines, take them exactly as prescribed by your health care provider.  Keep all follow-up appointments as directed by your health care provider.  Do not swim or drive or engage in risky activity during which a seizure could cause further injury to you or others until your health care provider says it is OK.  Get adequate rest.  Teach friends and family what to do if you have a seizure. They should:  Lay you on the ground to prevent a fall.  Put a cushion under your head.  Loosen any tight clothing around your neck.  Turn you on your side. If vomiting occurs, this helps keep your airway clear.  Stay with you until you recover.  Know whether or not you need emergency care. SEEK IMMEDIATE MEDICAL CARE IF:  The seizure lasts longer than 5 minutes.  The seizure is severe or you do not wake up immediately after the seizure.  You have an altered mental status after the seizure.  You  are having more frequent or worsening seizures. Someone should drive you to the emergency department or call local emergency services (911 in [place] MAKE SURE YOU:  Understand these instructions.  Will watch your condition.  Will get help right away if you are not doing well or get worse.   This information is not intended to replace advice given to you by your health care provider. Make sure you discuss any questions you have with your health care provider.   Document Released: 09/29/2000 Document Revised: 10/23/2014 Document Reviewed: 05/14/2013 Elsevier Interactive Patient Education [employer]2016 Elsevier Inc.

## 2018-08-22 ENCOUNTER — Emergency Department (HOSPITAL_COMMUNITY): Payer: No Typology Code available for payment source

## 2018-08-22 ENCOUNTER — Inpatient Hospital Stay (HOSPITAL_COMMUNITY)
Admission: EM | Admit: 2018-08-22 | Discharge: 2018-08-25 | DRG: 199 | Disposition: A | Discharge status: Home / Self Care | Payer: No Typology Code available for payment source | Attending: General Surgery | Admitting: General Surgery

## 2018-08-22 ENCOUNTER — Encounter (HOSPITAL_COMMUNITY): Payer: Self-pay | Admitting: Emergency Medicine

## 2018-08-22 ENCOUNTER — Inpatient Hospital Stay (HOSPITAL_COMMUNITY): Payer: No Typology Code available for payment source

## 2018-08-22 ENCOUNTER — Other Ambulatory Visit: Payer: Self-pay

## 2018-08-22 DIAGNOSIS — F17200 Nicotine dependence, unspecified, uncomplicated: Secondary | ICD-10-CM | POA: Diagnosis present

## 2018-08-22 DIAGNOSIS — S2242XA Multiple fractures of ribs, left side, initial encounter for closed fracture: Secondary | ICD-10-CM

## 2018-08-22 DIAGNOSIS — E739 Lactose intolerance, unspecified: Secondary | ICD-10-CM | POA: Diagnosis present

## 2018-08-22 DIAGNOSIS — R079 Chest pain, unspecified: Secondary | ICD-10-CM | POA: Diagnosis present

## 2018-08-22 DIAGNOSIS — S225XXA Flail chest, initial encounter for closed fracture: Secondary | ICD-10-CM | POA: Diagnosis present

## 2018-08-22 DIAGNOSIS — S81811A Laceration without foreign body, right lower leg, initial encounter: Secondary | ICD-10-CM | POA: Diagnosis present

## 2018-08-22 DIAGNOSIS — J939 Pneumothorax, unspecified: Secondary | ICD-10-CM | POA: Diagnosis present

## 2018-08-22 DIAGNOSIS — Y9289 Other specified places as the place of occurrence of the external cause: Secondary | ICD-10-CM | POA: Diagnosis not present

## 2018-08-22 DIAGNOSIS — Z23 Encounter for immunization: Secondary | ICD-10-CM

## 2018-08-22 DIAGNOSIS — S270XXA Traumatic pneumothorax, initial encounter: Secondary | ICD-10-CM | POA: Diagnosis present

## 2018-08-22 DIAGNOSIS — S2249XA Multiple fractures of ribs, unspecified side, initial encounter for closed fracture: Secondary | ICD-10-CM

## 2018-08-22 HISTORY — PX: CHEST TUBE INSERTION: SHX231

## 2018-08-22 HISTORY — DX: Multiple fractures of ribs, unspecified side, initial encounter for closed fracture: S22.49XA

## 2018-08-22 HISTORY — DX: Fracture of one rib, unspecified side, initial encounter for closed fracture: S22.39XA

## 2018-08-22 HISTORY — DX: Lactose intolerance, unspecified: E73.9

## 2018-08-22 HISTORY — DX: Acute pancreatitis without necrosis or infection, unspecified: K85.90

## 2018-08-22 LAB — CBC WITH DIFFERENTIAL/PLATELET
Abs Immature Granulocytes: 0.03 10*3/uL (ref 0.00–0.07)
BASOS ABS: 0 10*3/uL (ref 0.0–0.1)
Basophils Relative: 1 %
EOS ABS: 0.1 10*3/uL (ref 0.0–0.5)
EOS PCT: 1 %
HCT: 39.5 % (ref 39.0–52.0)
Hemoglobin: 12.9 g/dL — ABNORMAL LOW (ref 13.0–17.0)
IMMATURE GRANULOCYTES: 1 %
LYMPHS PCT: 22 %
Lymphs Abs: 1.2 10*3/uL (ref 0.7–4.0)
MCH: 28.8 pg (ref 26.0–34.0)
MCHC: 32.7 g/dL (ref 30.0–36.0)
MCV: 88.2 fL (ref 80.0–100.0)
Monocytes Absolute: 0.5 10*3/uL (ref 0.1–1.0)
Monocytes Relative: 10 %
NEUTROS PCT: 65 %
NRBC: 0 % (ref 0.0–0.2)
Neutro Abs: 3.6 10*3/uL (ref 1.7–7.7)
Platelets: 210 10*3/uL (ref 150–400)
RBC: 4.48 MIL/uL (ref 4.22–5.81)
RDW: 13.5 % (ref 11.5–15.5)
WBC: 5.5 10*3/uL (ref 4.0–10.5)

## 2018-08-22 LAB — COMPREHENSIVE METABOLIC PANEL
ALBUMIN: 4.3 g/dL (ref 3.5–5.0)
ALK PHOS: 33 U/L — AB (ref 38–126)
ALT: 16 U/L (ref 0–44)
ANION GAP: 13 (ref 5–15)
AST: 28 U/L (ref 15–41)
BUN: 14 mg/dL (ref 6–20)
CALCIUM: 8.8 mg/dL — AB (ref 8.9–10.3)
CO2: 23 mmol/L (ref 22–32)
Chloride: 99 mmol/L (ref 98–111)
Creatinine, Ser: 1.2 mg/dL (ref 0.61–1.24)
GFR calc non Af Amer: >60 mL/min (ref >60)
GLUCOSE: 88 mg/dL (ref 70–99)
POTASSIUM: 3.5 mmol/L (ref 3.5–5.1)
SODIUM: 135 mmol/L (ref 135–145)
Total Bilirubin: 1.3 mg/dL — ABNORMAL HIGH (ref 0.3–1.2)
Total Protein: 7.5 g/dL (ref 6.5–8.1)

## 2018-08-22 LAB — PROTIME-INR
INR: 1.12
Prothrombin Time: 14.3 seconds (ref 11.4–15.2)

## 2018-08-22 LAB — ETHANOL: Alcohol, Ethyl (B): <10 mg/dL (ref <10)

## 2018-08-22 MED ORDER — KETOROLAC TROMETHAMINE 30 MG/ML IJ SOLN
30.0000 mg | Freq: Three times a day (TID) | INTRAMUSCULAR | Status: DC
  Administered 2018-08-22 – 2018-08-23 (×2): 30 mg via INTRAVENOUS
  Filled 2018-08-22 (×2): qty 1

## 2018-08-22 MED ORDER — DEXTROSE-NACL 5-0.45 % IV SOLN
INTRAVENOUS | Status: DC
  Administered 2018-08-22 – 2018-08-23 (×2): via INTRAVENOUS

## 2018-08-22 MED ORDER — ONDANSETRON 4 MG PO TBDP
4.0000 mg | ORAL_TABLET | Freq: Four times a day (QID) | ORAL | Status: DC | PRN
  Administered 2018-08-24: 4 mg via ORAL
  Filled 2018-08-22: qty 1

## 2018-08-22 MED ORDER — DOCUSATE SODIUM 100 MG PO CAPS
100.0000 mg | ORAL_CAPSULE | Freq: Two times a day (BID) | ORAL | Status: DC
  Administered 2018-08-23 – 2018-08-25 (×5): 100 mg via ORAL
  Filled 2018-08-22 (×5): qty 1

## 2018-08-22 MED ORDER — IOHEXOL 300 MG/ML  SOLN
100.0000 mL | Freq: Once | INTRAMUSCULAR | Status: AC | PRN
  Administered 2018-08-22: 100 mL via INTRAVENOUS

## 2018-08-22 MED ORDER — MORPHINE SULFATE (PF) 2 MG/ML IV SOLN
2.0000 mg | INTRAVENOUS | Status: DC | PRN
  Administered 2018-08-22 – 2018-08-23 (×7): 4 mg via INTRAVENOUS
  Filled 2018-08-22 (×7): qty 2

## 2018-08-22 MED ORDER — OXYCODONE HCL 5 MG PO TABS: 5.0000 mg | ORAL_TABLET | ORAL | Status: DC | PRN

## 2018-08-22 MED ORDER — ONDANSETRON HCL 4 MG/2ML IJ SOLN
4.0000 mg | Freq: Four times a day (QID) | INTRAMUSCULAR | Status: DC | PRN
  Administered 2018-08-23: 4 mg via INTRAVENOUS
  Filled 2018-08-22: qty 2

## 2018-08-22 MED ORDER — MORPHINE SULFATE (PF) 4 MG/ML IV SOLN
4.0000 mg | Freq: Once | INTRAVENOUS | Status: AC
  Administered 2018-08-22: 4 mg via INTRAVENOUS
  Filled 2018-08-22: qty 1

## 2018-08-22 MED ORDER — ACETAMINOPHEN 325 MG PO TABS: 650.0000 mg | ORAL_TABLET | ORAL | Status: DC | PRN

## 2018-08-22 MED ORDER — SODIUM CHLORIDE 0.9 % IV SOLN: Freq: Once | INTRAVENOUS | Status: DC

## 2018-08-22 MED ORDER — LIDOCAINE HCL (PF) 1 % IJ SOLN
10.0000 mL | Freq: Once | INTRAMUSCULAR | Status: DC
  Filled 2018-08-22: qty 10
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
## 2018-08-22 NOTE — ED Notes (Signed)
Patient transported to CT 
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
## 2018-08-22 NOTE — Procedures (Signed)
Chest Tube Insertion Procedure Note  Indications:  Clinically significant Pneumothorax  Pre-operative Diagnosis: Left Pneumothorax  Post-operative Diagnosis: Left Pneumothorax  Procedure Details  Informed consent was obtained for the procedure, including sedation.  Risks of lung perforation, hemorrhage, arrhythmia, and adverse drug reaction were discussed.   After sterile skin prep, using standard technique, a 16 French tube was placed in the left lateral 5th rib space by Seldinger technique.  Findings: Noted air expressed from needle draw and during tube insertion  Estimated Blood Loss:  Minimal         Specimens:  None              Complications:  None; patient tolerated the procedure well.         Disposition: to floor for admission         Condition: stable  Attending Attestation: I was present and scrubbed for the entire procedure.
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
## 2018-08-23 ENCOUNTER — Encounter (HOSPITAL_COMMUNITY): Payer: Self-pay | Admitting: General Practice

## 2018-08-23 ENCOUNTER — Inpatient Hospital Stay (HOSPITAL_COMMUNITY): Payer: No Typology Code available for payment source

## 2018-08-23 LAB — HIV ANTIBODY (ROUTINE TESTING W REFLEX): HIV Screen 4th Generation wRfx: NONREACTIVE

## 2018-08-23 LAB — URINALYSIS, ROUTINE W REFLEX MICROSCOPIC
BILIRUBIN URINE: NEGATIVE
Bacteria, UA: NONE SEEN
GLUCOSE, UA: NEGATIVE mg/dL
Ketones, ur: 20 mg/dL — AB
Leukocytes, UA: NEGATIVE
Nitrite: NEGATIVE
Protein, ur: NEGATIVE mg/dL
Specific Gravity, Urine: >1.046 — ABNORMAL HIGH (ref 1.005–1.030)
pH: 5 (ref 5.0–8.0)

## 2018-08-23 LAB — RAPID URINE DRUG SCREEN, HOSP PERFORMED
AMPHETAMINES: NOT DETECTED
Barbiturates: NOT DETECTED
Benzodiazepines: NOT DETECTED
Cocaine: NOT DETECTED
Opiates: POSITIVE — AB
TETRAHYDROCANNABINOL: NOT DETECTED

## 2018-08-23 LAB — BASIC METABOLIC PANEL WITH GFR
Anion gap: 9 (ref 5–15)
BUN: 16 mg/dL (ref 6–20)
CO2: 24 mmol/L (ref 22–32)
Calcium: 8.7 mg/dL — ABNORMAL LOW (ref 8.9–10.3)
Chloride: 103 mmol/L (ref 98–111)
Creatinine, Ser: 1.12 mg/dL (ref 0.61–1.24)
GFR calc Af Amer: >60 mL/min
GFR calc non Af Amer: >60 mL/min
Glucose, Bld: 103 mg/dL — ABNORMAL HIGH (ref 70–99)
Potassium: 3.8 mmol/L (ref 3.5–5.1)
Sodium: 136 mmol/L (ref 135–145)

## 2018-08-23 LAB — TYPE AND SCREEN
ABO/RH(D): B POS
ANTIBODY SCREEN: NEGATIVE

## 2018-08-23 LAB — CBC
HCT: 36.4 % — ABNORMAL LOW (ref 39.0–52.0)
Hemoglobin: 12 g/dL — ABNORMAL LOW (ref 13.0–17.0)
MCH: 28.9 pg (ref 26.0–34.0)
MCHC: 33 g/dL (ref 30.0–36.0)
MCV: 87.7 fL (ref 80.0–100.0)
Platelets: 179 10*3/uL (ref 150–400)
RBC: 4.15 MIL/uL — ABNORMAL LOW (ref 4.22–5.81)
RDW: 13.5 % (ref 11.5–15.5)
WBC: 9.8 10*3/uL (ref 4.0–10.5)
nRBC: 0 % (ref 0.0–0.2)

## 2018-08-23 LAB — ABO/RH: ABO/RH(D): B POS

## 2018-08-23 MED ORDER — ACETAMINOPHEN 500 MG PO TABS
1000.0000 mg | ORAL_TABLET | Freq: Four times a day (QID) | ORAL | Status: DC
  Administered 2018-08-23 – 2018-08-25 (×8): 1000 mg via ORAL
  Filled 2018-08-23 (×10): qty 2

## 2018-08-23 MED ORDER — MORPHINE SULFATE (PF) 2 MG/ML IV SOLN
2.0000 mg | INTRAVENOUS | Status: DC | PRN
  Administered 2018-08-23 – 2018-08-25 (×4): 4 mg via INTRAVENOUS
  Administered 2018-08-25 (×2): 2 mg via INTRAVENOUS
  Filled 2018-08-23: qty 2
  Filled 2018-08-23: qty 1
  Filled 2018-08-23: qty 2
  Filled 2018-08-23: qty 1
  Filled 2018-08-23 (×3): qty 2

## 2018-08-23 MED ORDER — KETOROLAC TROMETHAMINE 30 MG/ML IJ SOLN
15.0000 mg | Freq: Three times a day (TID) | INTRAMUSCULAR | Status: DC
  Administered 2018-08-23 – 2018-08-24 (×3): 15 mg via INTRAVENOUS
  Filled 2018-08-23 (×3): qty 1

## 2018-08-23 MED ORDER — ENOXAPARIN SODIUM 40 MG/0.4ML ~~LOC~~ SOLN
40.0000 mg | SUBCUTANEOUS | Status: DC
  Administered 2018-08-23 – 2018-08-25 (×3): 40 mg via SUBCUTANEOUS
  Filled 2018-08-23 (×3): qty 0.4

## 2018-08-23 MED ORDER — INFLUENZA VAC SPLIT QUAD 0.5 ML IM SUSY
0.5000 mL | PREFILLED_SYRINGE | INTRAMUSCULAR | Status: AC
  Administered 2018-08-24: 0.5 mL via INTRAMUSCULAR
  Filled 2018-08-23: qty 0.5

## 2018-08-23 MED ORDER — OXYCODONE HCL 5 MG PO TABS
5.0000 mg | ORAL_TABLET | ORAL | Status: DC | PRN
  Administered 2018-08-23 (×2): 10 mg via ORAL
  Administered 2018-08-24 (×2): 5 mg via ORAL
  Administered 2018-08-24 – 2018-08-25 (×3): 10 mg via ORAL
  Filled 2018-08-23 (×2): qty 2
  Filled 2018-08-23 (×2): qty 1
  Filled 2018-08-23: qty 2
  Filled 2018-08-23 (×3): qty 1
  Filled 2018-08-23: qty 2

## 2018-08-23 MED ORDER — METHOCARBAMOL 750 MG PO TABS
750.0000 mg | ORAL_TABLET | Freq: Three times a day (TID) | ORAL | Status: DC
  Administered 2018-08-23 – 2018-08-25 (×9): 750 mg via ORAL
  Filled 2018-08-23 (×10): qty 1

## 2018-08-23 NOTE — Plan of Care (Signed)
  Problem: Education: Goal: Knowledge of General Education information will improve Description Including pain rating scale, medication(s)/side effects and non-pharmacologic comfort measures Outcome: Progressing   Problem: Health Behavior/Discharge Planning: Goal: Ability to manage health-related needs will improve Outcome: Progressing   Problem: Clinical Measurements: Goal: Ability to maintain clinical measurements within normal limits will improve Outcome: Progressing Goal: Will remain free from infection Outcome: Progressing Goal: Diagnostic test results will improve Outcome: Progressing Goal: Respiratory complications will improve Outcome: Progressing Goal: Cardiovascular complication will be avoided Outcome: Progressing   Problem: Activity: Goal: Risk for activity intolerance will decrease Outcome: Progressing   Problem: Nutrition: Goal: Adequate nutrition will be maintained Outcome: Progressing   Problem: Coping: Goal: Level of anxiety will decrease Outcome: Progressing   Problem: Elimination: Goal: Will not experience complications related to bowel motility Outcome: Progressing Goal: Will not experience complications related to urinary retention Outcome: Progressing   Problem: Pain Managment: Goal: General experience of comfort will improve Outcome: Progressing   Problem: Safety: Goal: Ability to remain free from injury will improve Outcome: Progressing   Problem: Skin Integrity: Goal: Risk for impaired skin integrity will decrease Outcome: Progressing  Sonny Masters, RN 08/23/2018

## 2018-08-23 NOTE — Progress Notes (Signed)
Received phone call from field case manager from worker's compensation, Andris Flurry:   Contact information:  Phone:  [phone] Fax:  [phone]  Faxed clinical information to her, per request.  She states she will update admitting department with claim information, once available.    Quintella Baton, RN, BSN  Trauma/Neuro ICU Case Manager [phone]
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
## 2018-08-24 ENCOUNTER — Inpatient Hospital Stay (HOSPITAL_COMMUNITY): Payer: No Typology Code available for payment source

## 2018-08-24 LAB — BASIC METABOLIC PANEL
Anion gap: 5 (ref 5–15)
BUN: 13 mg/dL (ref 6–20)
CALCIUM: 8.6 mg/dL — AB (ref 8.9–10.3)
CO2: 29 mmol/L (ref 22–32)
CREATININE: 1.26 mg/dL — AB (ref 0.61–1.24)
Chloride: 102 mmol/L (ref 98–111)
GFR calc non Af Amer: >60 mL/min (ref >60)
GLUCOSE: 111 mg/dL — AB (ref 70–99)
Potassium: 4 mmol/L (ref 3.5–5.1)
Sodium: 136 mmol/L (ref 135–145)

## 2018-08-24 MED ORDER — IBUPROFEN 600 MG PO TABS
600.0000 mg | ORAL_TABLET | Freq: Four times a day (QID) | ORAL | Status: DC | PRN
  Administered 2018-08-24: 600 mg via ORAL
  Filled 2018-08-24: qty 1

## 2018-08-24 MED ORDER — DIPHENHYDRAMINE HCL 25 MG PO CAPS: 25.0000 mg | ORAL_CAPSULE | Freq: Four times a day (QID) | ORAL | Status: DC | PRN
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
## 2018-08-24 NOTE — Progress Notes (Signed)
CSW spoke with patient at bedside. He reported that he is feeling a little better but felt like the simple task of walking down the hall was difficult. CSW inquired if he needed any resource and patient declined. SBIRT already completed.  CSW signing off as no further needs present.   Osborne Casco Mekayla Soman LCSW [phone]

## 2018-08-24 NOTE — Progress Notes (Signed)
Received call from Andris Flurry w worker's comp requesting any new notes from today be faxed to [phone]. Completed.

## 2018-08-25 ENCOUNTER — Inpatient Hospital Stay (HOSPITAL_COMMUNITY): Payer: No Typology Code available for payment source

## 2018-08-25 MED ORDER — DOCUSATE SODIUM 100 MG PO CAPS: 100.0000 mg | ORAL_CAPSULE | Freq: Two times a day (BID) | ORAL | 0 refills | Status: DC

## 2018-08-25 MED ORDER — ACETAMINOPHEN 500 MG PO TABS: 1000.0000 mg | ORAL_TABLET | Freq: Four times a day (QID) | ORAL | 0 refills | Status: DC

## 2018-08-25 MED ORDER — OXYCODONE HCL 5 MG PO TABS: 5.0000 mg | ORAL_TABLET | Freq: Four times a day (QID) | ORAL | 0 refills | Status: DC | PRN

## 2018-08-25 MED ORDER — METHOCARBAMOL 750 MG PO TABS: 750.0000 mg | ORAL_TABLET | Freq: Three times a day (TID) | ORAL | 0 refills | Status: DC
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
## 2018-08-25 NOTE — Progress Notes (Signed)
Luis Brooks to be D/C'd  per MD order. Discussed with the patient and all questions fully answered.  VSS, Skin clean, dry and intact without evidence of skin break down, no evidence of skin tears noted.  IV catheter discontinued intact. Site without signs and symptoms of complications. Dressing and pressure applied.  An After Visit Summary was printed and given to the patient. Patient received prescription.  D/c education completed with patient/family including follow up instructions, medication list, d/c activities limitations if indicated, with other d/c instructions as indicated by MD - patient able to verbalize understanding, all questions fully answered.   Patient instructed to return to ED, call 911, or call MD for any changes in condition.   Patient to be escorted via WC, and D/C home via private auto.

## 2018-08-25 NOTE — Discharge Instructions (Signed)
Pneumothorax °A pneumothorax, commonly called a collapsed lung, is a condition in which air leaks from a lung and builds up in the space between the lung and the chest wall (pleural space). The air in a pneumothorax is trapped outside the lung and takes up space, preventing the lung from fully expanding. This is a condition that usually occurs suddenly. The buildup of air may be small or large. A small pneumothorax may go away on its own. When a pneumothorax is larger, it will often require medical treatment and hospitalization. °What are the causes? °A pneumothorax can sometimes happen quickly with no apparent cause. People with underlying lung problems, particularly COPD or emphysema, are at higher risk of pneumothorax. However, pneumothorax can happen quickly even in people with no prior known lung problems. Trauma, surgery, medical procedures, or injury to the chest wall can also cause a pneumothorax. °What are the signs or symptoms? °Sometimes a pneumothorax will have no symptoms. When symptoms are present, they can include: °· Chest pain. °· Shortness of breath. °· Increased rate of breathing. °· Bluish color to your lips or skin (cyanosis). ° °How is this diagnosed? °Pneumothorax is usually diagnosed by a chest X-ray or chest CT scan. Your health care provider will also take a medical history and perform a physical exam to determine why you may have a pneumothorax. °How is this treated? °A small pneumothorax may go away on its own without treatment. Extra oxygen can sometimes help a small pneumothorax go away more quickly. For a larger pneumothorax or a pneumothorax that is causing symptoms, a procedure is usually needed to drain the air. In some cases, the health care provider may drain the air using a needle. In other cases, a chest tube may be inserted into the pleural space. A chest tube is a small tube placed between the ribs and into the pleural space. This removes the extra air and allows the lung to  expand back to its normal size. A large pneumothorax will usually require a hospital stay. If there is ongoing air leakage into the pleural space, then the chest tube may need to remain in place for several days until the air leak has healed. In some cases, surgery may be needed. °Follow these instructions at home: °· Only take over-the-counter or prescription medicines as directed by your health care provider. °· If a cough or pain makes it difficult for you to sleep at night, try sleeping in a semi-upright position in a recliner or by using 2 or 3 pillows. °· Rest and limit activity as directed by your health care provider. °· If you had a chest tube and it was removed, ask your health care provider when it is okay to remove the dressing. Until your health care provider says you can remove the dressing, do not allow it to get wet. °· Do not smoke. Smoking is a risk factor for pneumothorax. °· Do not fly in an airplane or scuba dive until your health care provider says it is okay. °· Follow up with your health care provider as directed. °Get help right away if: °· You have increasing chest pain or shortness of breath. °· You have a cough that is not controlled with suppressants. °· You begin coughing up blood. °· You have pain that is getting worse or is not controlled with medicines. °· You cough up thick, discolored mucus (sputum) that is yellow to green in color. °· You have redness, increasing pain, or discharge at the site where a   chest tube had been in place (if your pneumothorax was treated with a chest tube).  The site where your chest tube was located opens up.  You feel air coming out of the site where the chest tube was placed.  You have a fever or persistent symptoms for more than 2-3 days.  You have a fever and your symptoms suddenly get worse. This information is not intended to replace advice given to you by your health care provider. Make sure you discuss any questions you have with your  health care provider. Document Released: 10/02/2005 Document Revised: 03/09/2016 Document Reviewed: 02/25/2014 Elsevier Interactive Patient Education  2018 Else[employer]   Rib Fracture A rib fracture is a break or crack in one of the bones of the ribs. The ribs are like a cage that goes around your upper chest. A broken or cracked rib is often painful, but most do not cause other problems. Most rib fractures heal on their own in 1-3 months. Follow these instructions at home:  Avoid activities that cause pain to the injured area. Protect your injured area.  Slowly increase activity as told by your doctor.  Take medicine as told by your doctor.  Put ice on the injured area for the first 1-2 days after you have been treated or as told by your doctor. ? Put ice in a plastic bag. ? Place a towel between your skin and the bag. ? Leave the ice on for 15-20 minutes at a time, every 2 hours while you are awake.  Do deep breathing as told by your doctor. You may be told to: ? Take deep breaths many times a day. ? Cough many times a day while hugging a pillow. ? Use a device (incentive spirometer) to perform deep breathing many times a day.  Drink enough fluids to keep your pee (urine) clear or pale yellow.  Do not wear a rib belt or binder. These do not allow you to breathe deeply. Get help right away if:  You have a fever.  You have trouble breathing.  You cannot stop coughing.  You cough up thick or bloody spit (mucus).  You feel sick to your stomach (nauseous), throw up (vomit), or have belly (abdominal) pain.  Your pain gets worse and medicine does not help. This information is not intended to replace advice given to you by your health care provider. Make sure you discuss any questions you have with your health care provider. Document Released: 07/11/2008 Document Revised: 03/09/2016 Document Reviewed: 12/04/2012 Elsevier Interactive Patient Education  [employer].  1. PAIN CONTROL:  1. Pain is best controlled by a usual combination of three different methods TOGETHER:  1. Ice/Heat 2. Over the counter pain medication 3. Prescription pain medication 2. Most patients will experience some swelling and bruising around wounds. Ice packs or heating pads (30-60 minutes up to 6 times a day) will help. Use ice for the first few days to help decrease swelling and bruising, then switch to heat to help relax tight/sore spots and speed recovery. Some people prefer to use ice alone, heat alone, alternating between ice & heat. Experiment to what works for you. Swelling and bruising can take several weeks to resolve.  3. It is helpful to take an over-the-counter pain medication regularly for the first few weeks. Choose one of the following that works best for you:  1. Naproxen (Aleve, etc) Two 220mg  tabs twice a day 2. Ibuprofen (Advil, etc) Three 200mg  tabs four times  a day (every meal & bedtime) 3. Acetaminophen (Tylenol, etc) 500-650mg  four times a day (every meal & bedtime) 4. A prescription for pain medication (such as oxycodone, hydrocodone, etc) should be given to you upon discharge. Take your pain medication as prescribed.  1. If you are having problems/concerns with the prescription medicine (does not control pain, nausea, vomiting, rash, itching, etc), please call us [phone] to see if we need to switch you to a different pain medicine that will work better for you and/or control your side effect better. 2. If you need a refill on your pain medication, please contact your pharmacy. They will contact our office to request authorization. Prescriptions will not be filled after 5 pm or on week-ends. 4. Avoid getting constipated. When taking pain medications, it is common to experience some constipation. Increasing fluid intake and taking a fiber supplement (such as Metamucil, Citrucel, FiberCon, MiraLax, etc) 1-2 times a day regularly will usually help prevent  this problem from occurring. A mild laxative (prune juice, Milk of Magnesia, MiraLax, etc) should be taken according to package directions if there are no bowel movements after 48 hours.  5. Watch out for diarrhea. If you have many loose bowel movements, simplify your diet to bland foods & liquids for a few days. Stop any stool softeners and decrease your fiber supplement. Switching to mild anti-diarrheal medications (Kayopectate, Pepto Bismol) can help. If this worsens or does not improve, please call us. 6. Wash / shower. You may remove your chest tube dressing and shower on 11/13. No bathing or submerging your wounds in water until they heal. 7. FOLLOW UP in our office  1. See follow up   WHEN TO CALL us [phone]:  1. Poor pain control 2. Reactions / problems with new medications (rash/itching, nausea, etc)  3. Fever over 101.5 F (38.5 C) 4. Worsening swelling or bruising 5. Continued bleeding from wounds. 6. Increased pain, redness, or drainage from the wounds which could be signs of infection  The clinic staff is available to answer your questions during regular business hours (8:30am-5pm). Please dont hesitate to call and ask to speak to one of our nurses for clinical concerns.  If you have a medical emergency, go to the nearest emergency room or call 911.  A surgeon from [facility] is always on call at the [facility], [place]  [address], [address], [place], [place] [address] ?  MAIN: [phone] ? TOLL FREE: [phone] ?  FAX [phone]  [email or web address]

## 2018-08-25 NOTE — Progress Notes (Signed)
Dressing from chest tube saturated with blood.  MD notified.  Nurse instructed to reinforce dressing.  Will continue to monitor.
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
## 2018-09-03 ENCOUNTER — Ambulatory Visit
Admission: RE | Admit: 2018-09-03 | Discharge: 2018-09-03 | Disposition: A | Discharge status: Home / Self Care | Payer: 59 | Source: Ambulatory Visit | Attending: General Surgery | Admitting: General Surgery

## 2018-09-03 ENCOUNTER — Other Ambulatory Visit: Payer: Self-pay | Admitting: General Surgery

## 2018-09-03 DIAGNOSIS — J939 Pneumothorax, unspecified: Secondary | ICD-10-CM

## 2019-07-04 ENCOUNTER — Emergency Department (HOSPITAL_COMMUNITY)
Admission: EM | Admit: 2019-07-04 | Discharge: 2019-07-04 | Disposition: A | Discharge status: Home / Self Care | Payer: Self-pay | Attending: Emergency Medicine | Admitting: Emergency Medicine

## 2019-07-04 ENCOUNTER — Other Ambulatory Visit: Payer: Self-pay

## 2019-07-04 ENCOUNTER — Emergency Department (HOSPITAL_COMMUNITY): Payer: Self-pay

## 2019-07-04 ENCOUNTER — Encounter (HOSPITAL_COMMUNITY): Payer: Self-pay | Admitting: Emergency Medicine

## 2019-07-04 DIAGNOSIS — Y990 Civilian activity done for income or pay: Secondary | ICD-10-CM | POA: Insufficient documentation

## 2019-07-04 DIAGNOSIS — F17290 Nicotine dependence, other tobacco product, uncomplicated: Secondary | ICD-10-CM | POA: Insufficient documentation

## 2019-07-04 DIAGNOSIS — Z79899 Other long term (current) drug therapy: Secondary | ICD-10-CM | POA: Insufficient documentation

## 2019-07-04 DIAGNOSIS — F1099 Alcohol use, unspecified with unspecified alcohol-induced disorder: Secondary | ICD-10-CM | POA: Insufficient documentation

## 2019-07-04 DIAGNOSIS — Y9289 Other specified places as the place of occurrence of the external cause: Secondary | ICD-10-CM | POA: Insufficient documentation

## 2019-07-04 DIAGNOSIS — R569 Unspecified convulsions: Secondary | ICD-10-CM | POA: Insufficient documentation

## 2019-07-04 LAB — COMPREHENSIVE METABOLIC PANEL
ALT: 13 U/L (ref 0–44)
AST: 24 U/L (ref 15–41)
Albumin: 4 g/dL (ref 3.5–5.0)
Alkaline Phosphatase: 55 U/L (ref 38–126)
Anion gap: 7 (ref 5–15)
BUN: 10 mg/dL (ref 6–20)
CO2: 26 mmol/L (ref 22–32)
Calcium: 9.4 mg/dL (ref 8.9–10.3)
Chloride: 108 mmol/L (ref 98–111)
Creatinine, Ser: 0.99 mg/dL (ref 0.61–1.24)
GFR calc Af Amer: >60 mL/min (ref >60)
GFR calc non Af Amer: >60 mL/min (ref >60)
Glucose, Bld: 94 mg/dL (ref 70–99)
Potassium: 4.6 mmol/L (ref 3.5–5.1)
Sodium: 141 mmol/L (ref 135–145)
Total Bilirubin: 0.8 mg/dL (ref 0.3–1.2)
Total Protein: 7.6 g/dL (ref 6.5–8.1)

## 2019-07-04 LAB — CBC WITH DIFFERENTIAL/PLATELET
Abs Immature Granulocytes: 0.01 10*3/uL (ref 0.00–0.07)
Basophils Absolute: 0 10*3/uL (ref 0.0–0.1)
Basophils Relative: 1 %
Eosinophils Absolute: 0.1 10*3/uL (ref 0.0–0.5)
Eosinophils Relative: 1 %
HCT: 41.5 % (ref 39.0–52.0)
Hemoglobin: 14.2 g/dL (ref 13.0–17.0)
Immature Granulocytes: 0 %
Lymphocytes Relative: 14 %
Lymphs Abs: 0.7 10*3/uL (ref 0.7–4.0)
MCH: 32 pg (ref 26.0–34.0)
MCHC: 34.2 g/dL (ref 30.0–36.0)
MCV: 93.5 fL (ref 80.0–100.0)
Monocytes Absolute: 0.6 10*3/uL (ref 0.1–1.0)
Monocytes Relative: 12 %
Neutro Abs: 3.4 10*3/uL (ref 1.7–7.7)
Neutrophils Relative %: 72 %
Platelets: 189 10*3/uL (ref 150–400)
RBC: 4.44 MIL/uL (ref 4.22–5.81)
RDW: 13.1 % (ref 11.5–15.5)
WBC: 4.7 10*3/uL (ref 4.0–10.5)
nRBC: 0 % (ref 0.0–0.2)

## 2019-07-04 LAB — RAPID URINE DRUG SCREEN, HOSP PERFORMED
Amphetamines: NOT DETECTED
Barbiturates: NOT DETECTED
Benzodiazepines: NOT DETECTED
Cocaine: NOT DETECTED
Opiates: NOT DETECTED
Tetrahydrocannabinol: NOT DETECTED

## 2019-07-04 LAB — URINALYSIS, ROUTINE W REFLEX MICROSCOPIC
Bilirubin Urine: NEGATIVE
Glucose, UA: NEGATIVE mg/dL
Hgb urine dipstick: NEGATIVE
Ketones, ur: 5 mg/dL — AB
Leukocytes,Ua: NEGATIVE
Nitrite: NEGATIVE
Protein, ur: 100 mg/dL — AB
Specific Gravity, Urine: 1.021 (ref 1.005–1.030)
pH: 5 (ref 5.0–8.0)

## 2019-07-04 LAB — ETHANOL: Alcohol, Ethyl (B): <10 mg/dL (ref <10)

## 2019-07-04 MED ORDER — LEVETIRACETAM IN NACL 1000 MG/100ML IV SOLN
1000.0000 mg | Freq: Once | INTRAVENOUS | Status: AC
  Administered 2019-07-04: 17:00:00 1000 mg via INTRAVENOUS
  Filled 2019-07-04: qty 100

## 2019-07-04 MED ORDER — LEVETIRACETAM 750 MG PO TABS: 750.0000 mg | ORAL_TABLET | Freq: Two times a day (BID) | ORAL | 0 refills | Status: AC

## 2019-07-04 MED ORDER — SODIUM CHLORIDE 0.9 % IV BOLUS
1000.0000 mL | Freq: Once | INTRAVENOUS | Status: AC
  Administered 2019-07-04: 1000 mL via INTRAVENOUS

## 2019-07-04 NOTE — ED Notes (Signed)
Patient transported to CT 

## 2019-07-04 NOTE — Discharge Instructions (Signed)
Please take your prescription as directed.   It is recommended that since you had a seizure he should not drive until you are cleared by neurology.  You were given an ambulatory referral to a neurologist.  The neurologist will call you to schedule an appointment for follow-up.  Please return to the emergency department for any new or worsening symptoms.

## 2019-07-04 NOTE — ED Provider Notes (Signed)
[facility] EMERGENCY DEPARTMENT Provider Note   CSN: [id number] Arrival date & time: 07/04/19  1355     History   Chief Complaint Chief Complaint  Patient presents with  . Fall  . Seizures    HPI Luis Brooks is a 53 y.o. male.     HPI   Patient is a 53 year old male with a history of lactose intolerance, pancreatitis, rib fractures, who presents the emergency department today for evaluation of a possible seizure and fall.  Per EMS, patient was on the phone when staff heard him fall to the floor.  They went to his office and stated that he was having a seizure and that his head was underneath the desk.  Upon their arrival the patient was no longer seizing.  He was nonresponsive initially however became alert and oriented in route to the ED.  Blood sugar en route was 89. VS were reassuring.  Patient states that he has been in his normal state of health prior to this episode.  Denies any headaches, visual changes, numbness/weakness prior to or after the seizure.  Denies headaches.  Denies any chest pain, shortness of breath, recent fevers or infectious symptoms.  He denies drug use.  He does endorse alcohol use.  States he drinks about 2-3 alcoholic drinks about 3 times per week.  Denies any history of heavy EtOH use. Last drink was yesterday.   Patient stated that I could contact 1 of his coworkers in regards to the incident.  NIKE, who works at the facility.  He states he was working 2 offices away from the patient when he heard a loud bang.  He wanted to the patient's office and saw that he was on the floor.  Initially states that the patient was not breathing well.  He turned him on his side and then he began breathing normally.  States his body was shaking slightly mostly only the bilateral legs.  States that this lasted about 30-60 seconds and resolved.  Patient denies any episodes of vomiting.  He was diaphoretic.  Patient states he has had 1 seizure  in the past.  It was in the setting of being in his small room with noxious fumes without ventilation.  He has never followed up with a neurologist and he has never been on medication for seizures.  He denies any recent head trauma.  Past Medical History:  Diagnosis Date  . Lactose intolerance   . Pancreatitis    HISTORY OF   . Rib fractures 08/22/2018    Patient Active Problem List   Diagnosis Date Noted  . Pneumothorax on left 08/22/2018    Past Surgical History:  Procedure Laterality Date  . CHEST TUBE INSERTION Left 08/22/2018  . HAND SURGERY          Home Medications    Prior to Admission medications   Medication Sig Start Date End Date Taking? Authorizing Provider  acetaminophen (TYLENOL) 500 MG tablet Take 2 tablets (1,000 mg total) by mouth every 6 (six) hours. 08/25/18   Focht, Joyce Copa, PA  docusate sodium (COLACE) 100 MG capsule Take 1 capsule (100 mg total) by mouth 2 (two) times daily. 08/25/18   Focht, Joyce Copa, PA  levETIRAcetam (KEPPRA) 750 MG tablet Take 1 tablet (750 mg total) by mouth 2 (two) times daily. 07/04/19 08/03/19  Khyri Hinzman S, PA-C  methocarbamol (ROBAXIN) 750 MG tablet Take 1 tablet (750 mg total) by mouth 3 (three) times daily. 08/25/18  Focht, Jessica L, PA  oxyCODONE (OXY IR/ROXICODONE) 5 MG immediate release tablet Take 1 tablet (5 mg total) by mouth every 6 (six) hours as needed for moderate pain. 08/25/18   Jerre SimonFocht, Jessica L, PA    Family History No family history on file.  Social History Social History   Tobacco Use  . Smoking status: Current Every Day Smoker    Types: Cigars  . Smokeless tobacco: Never Used  Substance Use Topics  . Alcohol use: Yes    Comment: socailly   . Drug use: No     Allergies   Lactose intolerance (gi)   Review of Systems Review of Systems  Constitutional: Negative for fever.  HENT: Negative for ear pain and sore throat.   Eyes: Negative for visual disturbance.  Respiratory: Negative for  cough and shortness of breath.   Cardiovascular: Negative for chest pain and palpitations.  Gastrointestinal: Negative for abdominal pain, constipation, diarrhea, nausea and vomiting.  Genitourinary: Negative for dysuria.  Musculoskeletal: Negative for back pain and neck pain.  Skin: Negative for rash.  Neurological: Positive for seizures. Negative for dizziness, syncope, weakness, light-headedness, numbness and headaches.  All other systems reviewed and are negative.    Physical Exam Updated Vital Signs BP 136/82   Pulse 66   Temp 98.5 F (36.9 C) (Oral)   Resp 17   SpO2 100%   Physical Exam Vitals signs and nursing note reviewed.  Constitutional:      Appearance: He is well-developed.  HENT:     Head: Normocephalic and atraumatic.  Eyes:     Conjunctiva/sclera: Conjunctivae normal.  Neck:     Musculoskeletal: Neck supple.  Cardiovascular:     Rate and Rhythm: Normal rate and regular rhythm.     Heart sounds: No murmur.  Pulmonary:     Effort: Pulmonary effort is normal. No respiratory distress.     Breath sounds: Normal breath sounds.  Abdominal:     Palpations: Abdomen is soft.     Tenderness: There is no abdominal tenderness.  Skin:    General: Skin is warm and dry.  Neurological:     Mental Status: He is alert.     Comments: Mental Status:  Alert, thought content appropriate, able to give a coherent history. Speech fluent without evidence of aphasia. Able to follow 2 step commands without difficulty.  Cranial Nerves:  II:  pupils equal, round, reactive to light III,IV, VI: ptosis not present, extra-ocular motions intact bilaterally  V,VII: smile symmetric, facial light touch sensation equal VIII: hearing grossly normal to voice  X: uvula elevates symmetrically  XI: bilateral shoulder shrug symmetric and strong XII: midline tongue extension without fassiculations Motor:  Normal tone. 5/5 strength of BUE and BLE major muscle groups including strong and equal  grip strength and dorsiflexion/plantar flexion Sensory: light touch normal in all extremities. Cerebellar: normal finger-to-nose with bilateral upper extremities, normal heel to shin Gait: normal gait and balance. Able to walk on toes and heels with ease.      ED Treatments / Results  Labs (all labs ordered are listed, but only abnormal results are displayed) Labs Reviewed  CBC WITH DIFFERENTIAL/PLATELET  COMPREHENSIVE METABOLIC PANEL  ETHANOL  RAPID URINE DRUG SCREEN, HOSP PERFORMED  URINALYSIS, ROUTINE W REFLEX MICROSCOPIC    EKG EKG Interpretation  Date/Time:  Friday July 04 2019 14:04:01 EDT Ventricular Rate:  82 PR Interval:    QRS Duration: 80 QT Interval:  368 QTC Calculation: 430 R Axis:   84 Text  Interpretation:  Sinus rhythm No STEMI  Confirmed by Alvester Chourifan, Matthew [phone]([id number]) on 07/04/2019 2:40:56 PM   Radiology Ct Head Wo Contrast  Result Date: 07/04/2019 CLINICAL DATA:  Seizure EXAM: CT HEAD WITHOUT CONTRAST TECHNIQUE: Contiguous axial images were obtained from the base of the skull through the vertex without intravenous contrast. COMPARISON:  December 21, 2015 FINDINGS: Brain: The ventricles are normal in size and configuration. There is no intracranial mass, hemorrhage, extra-axial fluid collection, or midline shift. Brain parenchyma appears unremarkable. No acute infarct evident. Vascular: No hyperdense vessel. There is no appreciable vascular calcification. Skull: Bony calvarium appears intact. Sinuses/Orbits: There is mild mucosal thickening in several ethmoid air cells. Other visualized paranasal sinuses are clear. Orbits appear symmetric bilaterally. Other: Mastoid air cells are clear. IMPRESSION: Mild mucosal thickening in several ethmoid air cells. Study otherwise unremarkable. Electronically Signed   By: Bretta BangWilliam  Woodruff III M.D.   On: 07/04/2019 15:10    Procedures Procedures (including critical care time)  Medications Ordered in ED Medications   levETIRAcetam (KEPPRA) IVPB 1000 mg/100 mL premix (has no administration in time range)  sodium chloride 0.9 % bolus 1,000 mL (1,000 mLs Intravenous New Bag/Given 07/04/19 1501)     Initial Impression / Assessment and Plan / ED Course  I have reviewed the triage vital signs and the nursing notes.  Pertinent labs & imaging results that were available during my care of the patient were reviewed by me and considered in my medical decision making (see chart for details).  Clinical Course as of Jul 03 1626  Fri Jul 04, 2019  1514 IMPRESSION: Mild mucosal thickening in several ethmoid air cells. Study otherwise unremarkable.   [MT]    Clinical Course User Index [MT] Trifan, Kermit BaloMatthew J, MD     Final Clinical Impressions(s) / ED Diagnoses   Final diagnoses:  Seizure [facility](HCC)   53 year old male presenting for evaluation of seizure-like activity.  Has history of 1 seizure several years ago.  He is never followed up with neurology and is never been put on medications for this.  Today had witnessed seizure by a coworker at work.  Sounded to be somewhat generalized and lasting about 30 to 60 seconds.    On arrival to the ED, vital signs within normal limits.  Patient alert and oriented.  Nonfocal neuro exam.  We will obtain labs, UDS, EKG, CT head 2/2 head trauma.  CBC wnl CMP wnl  EKG with NSR. No ischemic changes or arrhythmia.   CT head negative for acute abnormality.   CIWA score is 0.  I have low suspicion for acute alcohol withdraw as a cause for his seizure.  Discussed case with neurology, Dr. Laurence SlateAroor, who recommended starting the patient on 750 mg twice daily of Keppra and also loading the patient with Keppra.  Advised to give seizure precautions and have him follow-up with neurology as an outpatient.  At shift change, UDS/UA are pending.  [facility] PA-C with plan to follow-up on pending work-up.   Discussed plan with patient.  Discussed seizure  precautions and plan to discharge him on Keppra with plan to follow-up with neurology.  He voiced understanding and is agreement plan.  All questions answered.  Patient stable discharge.  ED Discharge Orders         Ordered    Ambulatory referral to Neurology    Comments: An appointment is requested in approximately: 1-2 weeks   07/04/19 1626    levETIRAcetam (KEPPRA) 750 MG tablet  2 times daily     07/04/19 [address], [place] 07/04/19 1627    Wyvonnia Dusky, MD 07/04/19 1932

## 2019-07-04 NOTE — ED Triage Notes (Signed)
Per EMS- pt here w hx of seizures X 1, not taking any meds for this. Per EMS the pt was on the phone but then had a seizure that bystanders state was "about 10 minutes" the pt was awake and alert when they arrived.  18G PIV placed.

## 2019-07-04 NOTE — ED Provider Notes (Signed)
Pt signed out to me by C Couture, PA-C. Please see previous notes for further history.   In brief, patient presented for evaluation after seizure.  Also is a second seizure.  History of alcoholism.  Labs and work-up have been reassuring.  Consulted with neuro, encouraged patient to start on Keppra with a loading dose in the ED.  UA and UDS are pending, plan for discharge after resulting.  UA and UDS nonconcerning. Pt d/c with keppra and f/uy with neuro OP.    Franchot Heidelberg, PA-C 07/04/19 1654    Davonna Belling, MD 07/04/19 2037

## 2019-07-14 ENCOUNTER — Telehealth: Payer: Self-pay | Admitting: *Deleted

## 2019-07-14 ENCOUNTER — Ambulatory Visit: Payer: Self-pay | Admitting: Diagnostic Neuroimaging

## 2019-07-14 NOTE — Telephone Encounter (Signed)
Patient called this morning and canceled her new patient appointment this morning. She rescheduled for Wed.

## 2019-07-15 ENCOUNTER — Encounter: Payer: Self-pay | Admitting: Diagnostic Neuroimaging

## 2019-07-16 ENCOUNTER — Ambulatory Visit: Payer: Self-pay | Admitting: Diagnostic Neuroimaging

## 2019-07-16 ENCOUNTER — Encounter: Payer: Self-pay | Admitting: Diagnostic Neuroimaging

## 2019-07-16 NOTE — Telephone Encounter (Signed)
Patient was no show for new patient appointment today after late cancellation and reschedule on Monday.

## 2021-11-30 ENCOUNTER — Emergency Department (HOSPITAL_COMMUNITY): Payer: No Typology Code available for payment source

## 2021-11-30 ENCOUNTER — Encounter (HOSPITAL_COMMUNITY): Payer: Self-pay

## 2021-11-30 ENCOUNTER — Emergency Department (HOSPITAL_COMMUNITY)
Admission: EM | Admit: 2021-11-30 | Discharge: 2021-11-30 | Discharge status: Against Medical Advice | Payer: No Typology Code available for payment source | Attending: Emergency Medicine | Admitting: Emergency Medicine

## 2021-11-30 DIAGNOSIS — W010XXA Fall on same level from slipping, tripping and stumbling without subsequent striking against object, initial encounter: Secondary | ICD-10-CM | POA: Insufficient documentation

## 2021-11-30 DIAGNOSIS — Z7982 Long term (current) use of aspirin: Secondary | ICD-10-CM | POA: Insufficient documentation

## 2021-11-30 DIAGNOSIS — Y92009 Unspecified place in unspecified non-institutional (private) residence as the place of occurrence of the external cause: Secondary | ICD-10-CM | POA: Diagnosis not present

## 2021-11-30 DIAGNOSIS — S82892A Other fracture of left lower leg, initial encounter for closed fracture: Secondary | ICD-10-CM

## 2021-11-30 DIAGNOSIS — S82435A Nondisplaced oblique fracture of shaft of left fibula, initial encounter for closed fracture: Secondary | ICD-10-CM | POA: Diagnosis not present

## 2021-11-30 DIAGNOSIS — R569 Unspecified convulsions: Secondary | ICD-10-CM

## 2021-11-30 DIAGNOSIS — S8992XA Unspecified injury of left lower leg, initial encounter: Secondary | ICD-10-CM | POA: Diagnosis present

## 2021-11-30 HISTORY — DX: Unspecified convulsions: R56.9

## 2021-11-30 LAB — CBC WITH DIFFERENTIAL/PLATELET
Abs Immature Granulocytes: 0.02 10*3/uL (ref 0.00–0.07)
Basophils Absolute: 0 10*3/uL (ref 0.0–0.1)
Basophils Relative: 1 %
Eosinophils Absolute: 0 10*3/uL (ref 0.0–0.5)
Eosinophils Relative: 1 %
HCT: 31.9 % — ABNORMAL LOW (ref 39.0–52.0)
Hemoglobin: 10.7 g/dL — ABNORMAL LOW (ref 13.0–17.0)
Immature Granulocytes: 1 %
Lymphocytes Relative: 6 %
Lymphs Abs: 0.2 10*3/uL — ABNORMAL LOW (ref 0.7–4.0)
MCH: 29.9 pg (ref 26.0–34.0)
MCHC: 33.5 g/dL (ref 30.0–36.0)
MCV: 89.1 fL (ref 80.0–100.0)
Monocytes Absolute: 0.3 10*3/uL (ref 0.1–1.0)
Monocytes Relative: 8 %
Neutro Abs: 3.4 10*3/uL (ref 1.7–7.7)
Neutrophils Relative %: 83 %
Platelets: 240 10*3/uL (ref 150–400)
RBC: 3.58 MIL/uL — ABNORMAL LOW (ref 4.22–5.81)
RDW: 15.1 % (ref 11.5–15.5)
WBC: 4 10*3/uL (ref 4.0–10.5)
nRBC: 0 % (ref 0.0–0.2)

## 2021-11-30 LAB — ETHANOL: Alcohol, Ethyl (B): <10 mg/dL (ref <10)

## 2021-11-30 LAB — BASIC METABOLIC PANEL
Anion gap: 18 — ABNORMAL HIGH (ref 5–15)
BUN: 7 mg/dL (ref 6–20)
CO2: 20 mmol/L — ABNORMAL LOW (ref 22–32)
Calcium: 9 mg/dL (ref 8.9–10.3)
Chloride: 90 mmol/L — ABNORMAL LOW (ref 98–111)
Creatinine, Ser: 0.91 mg/dL (ref 0.61–1.24)
GFR, Estimated: >60 mL/min (ref >60)
Glucose, Bld: 127 mg/dL — ABNORMAL HIGH (ref 70–99)
Potassium: 4 mmol/L (ref 3.5–5.1)
Sodium: 128 mmol/L — ABNORMAL LOW (ref 135–145)

## 2021-11-30 MED ORDER — HYDROCODONE-ACETAMINOPHEN 5-325 MG PO TABS
1.0000 | ORAL_TABLET | Freq: Once | ORAL | Status: AC
  Administered 2021-11-30: 1 via ORAL
  Filled 2021-11-30: qty 1

## 2021-11-30 MED ORDER — LEVETIRACETAM 500 MG PO TABS: 500.0000 mg | ORAL_TABLET | Freq: Two times a day (BID) | ORAL | 1 refills | Status: AC

## 2021-11-30 MED ORDER — HYDROCODONE-ACETAMINOPHEN 5-325 MG PO TABS: 1.0000 | ORAL_TABLET | Freq: Four times a day (QID) | ORAL | 0 refills | Status: AC | PRN

## 2021-11-30 MED ORDER — LORAZEPAM 2 MG/ML IJ SOLN
2.0000 mg | Freq: Once | INTRAMUSCULAR | Status: AC
  Administered 2021-11-30: 2 mg via INTRAVENOUS

## 2021-11-30 MED ORDER — LEVETIRACETAM IN NACL 1000 MG/100ML IV SOLN
1000.0000 mg | Freq: Once | INTRAVENOUS | Status: AC
  Administered 2021-11-30: 1000 mg via INTRAVENOUS
  Filled 2021-11-30: qty 100

## 2021-11-30 NOTE — ED Notes (Signed)
[facility] on the way to transport pt home.

## 2021-11-30 NOTE — Discharge Instructions (Addendum)
Return for any problem. If you change your mind regarding seeking care for your alcohol use, return to the ED.  Do not bear weight on left ankle until cleared by orthopedics.   Use crutches and boot as instructed.

## 2021-11-30 NOTE — ED Notes (Signed)
Instructed pt on use of crutches. Pt verbalized understanding and attempted return demonstration. While ambulating with crutches, pt lost balance and fell against wall. This Clinical research associate unable to intervene in time. Pt denied hitting head, reports hitting right shoulder and right knee. Redness noted to right shoulder. EDP Messick at bedside. Pt offered scans and again the opportunity to stay d/t concerns about ability to ambulate. Pt declined scans and admission. Wheeled pt to lobby to await safe transport.

## 2021-11-30 NOTE — ED Notes (Signed)
Attempted safe transport x2

## 2021-11-30 NOTE — ED Provider Notes (Addendum)
[facility]-EMERGENCY DEPT Provider Note   CSN: [id number] Arrival date & time: 11/30/21  0859     History  Chief Complaint  Patient presents with   Ankle Pain    Luis Brooks is a 56 y.o. male.  56 year old male with prior medical history as detailed below presents for evaluation.  Patient reports that he was at home.  He slipped.  He twisted his left ankle.  He now has significant pain to the lateral aspect of the left ankle.  He was able to ambulate after his near fall.  He reports improvement in pain after administration of fentanyl by EMS.  Patient denies other injury.  He denies knee pain.    The history is provided by the patient and medical records.  Ankle Pain Location:  Ankle Time since incident:  30 minutes Injury: yes   Mechanism of injury: fall   Ankle location:  L ankle Pain details:    Quality:  Aching   Severity:  Moderate   Onset quality:  Sudden   Duration:  1 hour   Timing:  Constant   Progression:  Unchanged Chronicity:  New     Home Medications Prior to Admission medications   Medication Sig Start Date End Date Taking? Authorizing Provider  Aspirin-Salicylamide-Caffeine (BC HEADACHE POWDER PO) Take 1 packet by mouth daily as needed (For headache).    [provider]  Dextran 70-Hypromellose (ARTIFICIAL TEARS PF OP) Place 1 drop into both eyes daily as needed (Dry eyes).    [provider]  ibuprofen (ADVIL) 200 MG tablet Take 200 mg by mouth every 6 (six) hours as needed for moderate pain.    [provider]  levETIRAcetam (KEPPRA) 750 MG tablet Take 1 tablet (750 mg total) by mouth 2 (two) times daily. 07/04/19 08/03/19  Couture, Cortni S, PA-C      Allergies    Lactose intolerance (gi)    Review of Systems   Review of Systems  All other systems reviewed and are negative.  Physical Exam Updated Vital Signs BP 140/88 (BP Location: Left Arm)    Pulse 92    Temp 98.7 F (37.1 C) (Oral)     Resp 18    Ht 6\' 1"  (1.854 m)    SpO2 100%    BMI 23.12 kg/m  Physical Exam Vitals and nursing note reviewed.  Constitutional:      General: He is not in acute distress.    Appearance: Normal appearance. He is well-developed.  HENT:     Head: Normocephalic and atraumatic.  Eyes:     Conjunctiva/sclera: Conjunctivae normal.     Pupils: Pupils are equal, round, and reactive to light.  Cardiovascular:     Rate and Rhythm: Normal rate and regular rhythm.     Heart sounds: Normal heart sounds.  Pulmonary:     Effort: Pulmonary effort is normal. No respiratory distress.     Breath sounds: Normal breath sounds.  Abdominal:     General: There is no distension.     Palpations: Abdomen is soft.     Tenderness: There is no abdominal tenderness.  Musculoskeletal:        General: Tenderness present. No deformity. Normal range of motion.     Cervical back: Normal range of motion and neck supple.     Comments: Moderate tenderness over the lateral malleolus of the left ankle.  Moderate edema noted to the same area.  Distal left lower extremity is neurovascular intact.  Skin:  General: Skin is warm and dry.  Neurological:     General: No focal deficit present.     Mental Status: He is alert and oriented to person, place, and time.    ED Results / Procedures / Treatments   Labs (all labs ordered are listed, but only abnormal results are displayed) Labs Reviewed - No data to display  EKG None  Radiology DG Ankle Complete Left  Result Date: 11/30/2021 CLINICAL DATA:  56 year old male status post fall yesterday with continued pain and swelling. EXAM: LEFT ANKLE COMPLETE - 3+ VIEW COMPARISON:  None. FINDINGS: Oblique nondisplaced fracture of the distal left fibula metadiaphysis, most visible on the cross-table lateral view. Regional soft tissue swelling. Mortise joint alignment maintained. Talar dome intact. Distal tibia and calcaneus appear intact. IMPRESSION: Oblique nondisplaced  fracture of the distal left fibula metadiaphysis. Electronically Signed   By: Odessa Fleming M.D.   On: 11/30/2021 09:40    Procedures Procedures    Medications Ordered in ED Medications - No data to display  ED Course/ Medical Decision Making/ A&P                           Medical Decision Making Amount and/or Complexity of Data Reviewed Labs: ordered. Radiology: ordered.  Risk Prescription drug management.    Medical Screen Complete  This patient presented to the ED with complaint of ankle pain.  This complaint involves an extensive number of treatment options. The initial differential diagnosis includes, but is not limited to, strain, ankle fracture, etc.  This presentation is: Acute, Self-Limited, Previously Undiagnosed, Uncertain Prognosis, and Complicated  Patient presents after slip and fall.  He has left ankle pain after the fall.  Imaging reveals evidence of distal left fibula fracture.    Patient's pain is improved with administration of Norco  Walking boot and crutches provided.  Patient does understand need for close follow-up with orthopedics.  Strict return precautions given and understood.   1120 Patient was about to be discharged.  Cam boot was in place.  Patient had witnessed seizure while in the room.  Generalized tonic-clonic activity reported.  Seizure resolved on its own after 3 minutes.  Patient without reported trauma.  Patient without evidence of hypoxia or desaturation or aspiration.  Patient with history of seizure.  2 mg Ativan given.  Keppra load initiated.  Screening labs to be obtained.  Screening CT head to be obtained.  1254 Patient is now significantly improved.  He is now alert and oriented.  He is no longer postictal.  He cannot recall the exact events just prior to the seizure this morning.  Patient admits that he drinks heavily.  His last alcoholic intake was approximately 36 hours prior to arrival today.  He drinks approximately 12-20  beers daily.  He reports that his last seizure was in 2009.  Chart review suggest that his last seizure was at least in 2020.  He does not take antiepileptics regularly.  Obtain screening labs are remarkable for sodium of 128.  Other labs and imaging thus far obtained are without significant abnormality.  He is denying history of significant alcohol withdrawal seizure in the past.  1330   Patient with likely alcohol withdrawal seizure.  Patient refuses.  He has capacity at this time to refuse care.  He will be leaving AMA.    Additional history obtained:  Additional history obtained from EMS External records from outside sources obtained and reviewed including prior  ED visits and prior Inpatient records.    Imaging Studies ordered:  I ordered imaging studies including left ankle xr  I independently visualized and interpreted obtained imaging which showed distal fib fx I agree with the radiologist interpretation.   Medicines ordered:  I ordered medication including norco  for pain  Reevaluation of the patient after these medicines showed that the patient: improved   Problem List / ED Course:  Left ankle fracture   Reevaluation:  After the interventions noted above, I reevaluated the patient and found that they have: improved   Disposition:  After consideration of the diagnostic results and the patients response to treatment, I feel that the patent would benefit from close outpatient follow up with orthopedics.     CRITICAL CARE Performed by: Wynetta Fines   Total critical care time: 45 minutes  Critical care time was exclusive of separately billable procedures and treating other patients.  Critical care was necessary to treat or prevent imminent or life-threatening deterioration.  Critical care was time spent personally by me on the following activities: development of treatment plan with patient and/or surrogate as well as nursing, discussions with consultants,  evaluation of patient's response to treatment, examination of patient, obtaining history from patient or surrogate, ordering and performing treatments and interventions, ordering and review of laboratory studies, ordering and review of radiographic studies, pulse oximetry and re-evaluation of patient's condition.      Final Clinical Impression(s) / ED Diagnoses Final diagnoses:  Closed fracture of left ankle, initial encounter    Rx / DC Orders ED Discharge Orders          Ordered    HYDROcodone-acetaminophen (NORCO/VICODIN) 5-325 MG tablet  Every 6 hours PRN        11/30/21 1030              Wynetta Fines, MD 11/30/21 1033    Wynetta Fines, MD 11/30/21 1336

## 2021-11-30 NOTE — ED Notes (Signed)
I provided reinforced discharge education based off of discharge instructions. Pt acknowledged and understood my education. Pt had no further questions/concerns for provider/myself.  °

## 2021-11-30 NOTE — ED Notes (Signed)
Called safe transport, left a voicemail in regards to pt transport home.

## 2021-11-30 NOTE — ED Triage Notes (Addendum)
Pt arrived via EMS, from home, slip and fall in kitchen. C/o left ankle pain. Swelling noted.   100 mcg fentanyl  4mg  zofran 20 L FA

## 2021-12-02 ENCOUNTER — Telehealth: Payer: Self-pay | Admitting: *Deleted

## 2021-12-02 NOTE — Telephone Encounter (Signed)
Pt called regarding pharmacy not having hydrocodone in stock.  Pt requests Rx be sent to alternate pharmacy ([facility] on [address] [facility]).

## 2022-06-20 IMAGING — CT CT HEAD W/O CM
3 series · 15 of 47 positions shown, 18 images · non-contrast
Comparison: Head CT 07/04/2019

CLINICAL DATA: Seizure, new-onset, history of trauma



[Series 2: head wo · axial · 0.47mm/px · z∈[-130,-5]mm · 9 of 30 slices shown, 12 images]
[im 3/30  brain]
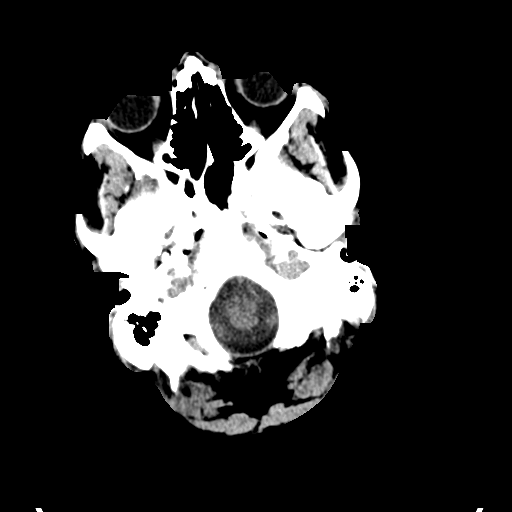
[im 3/30  bone]
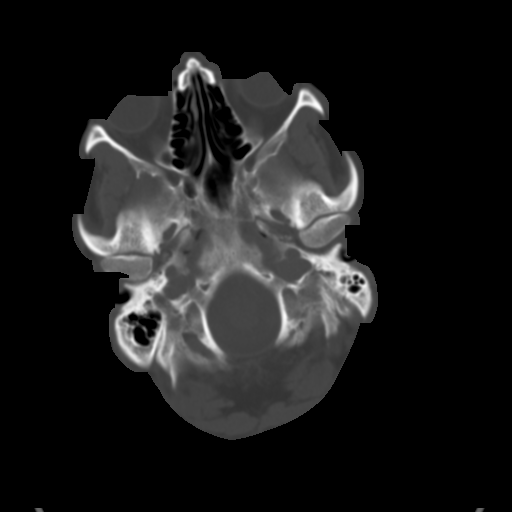
[im 6/30  brain]
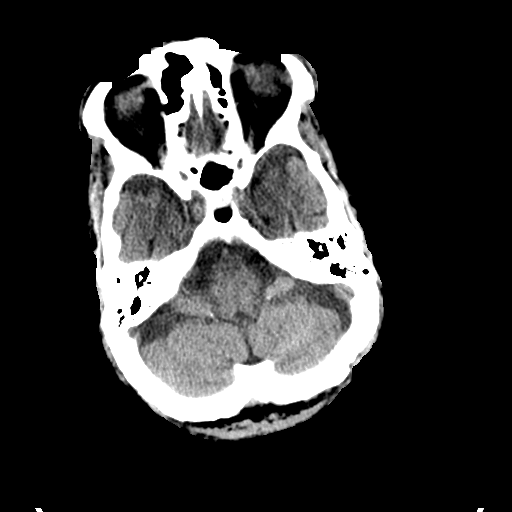
[im 9/30  brain]
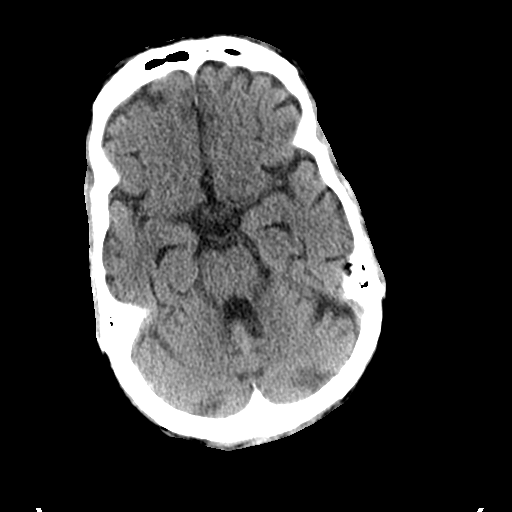
[im 12/30  brain]
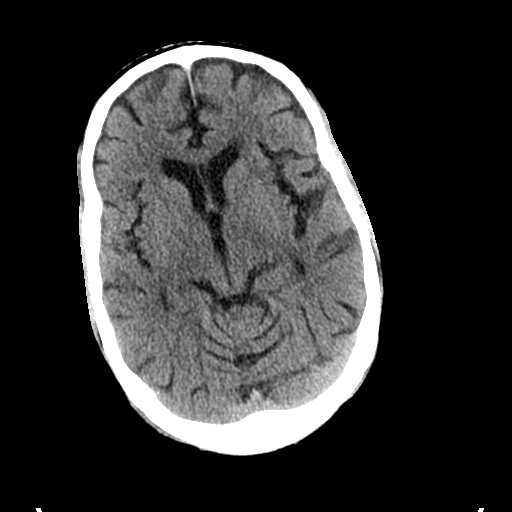
[im 16/30  brain]
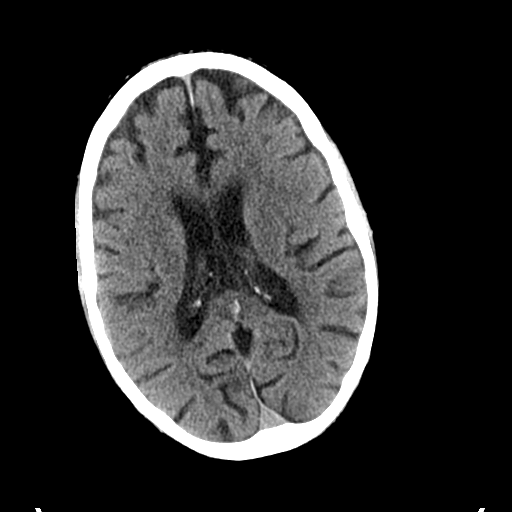
[im 16/30  bone]
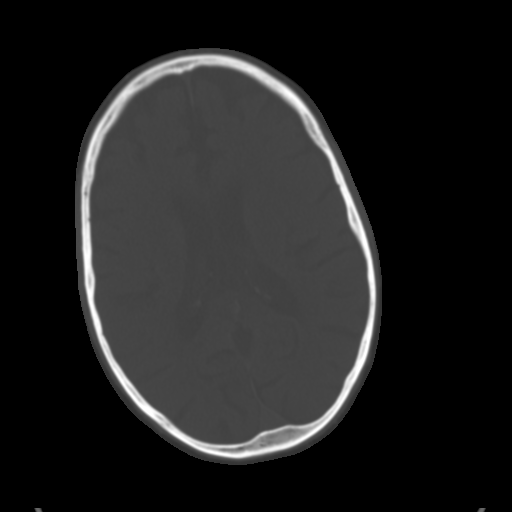
[im 19/30  brain]
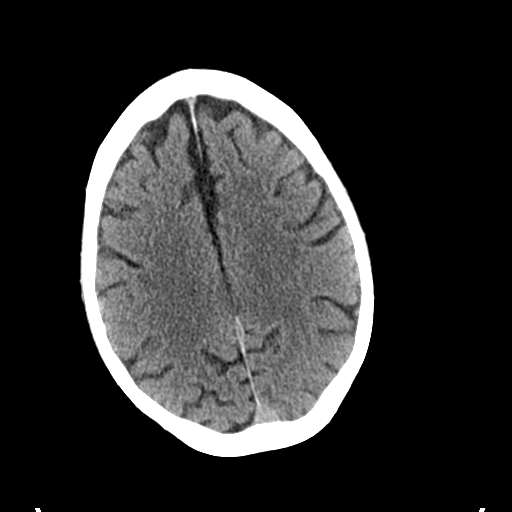
[im 22/30  brain]
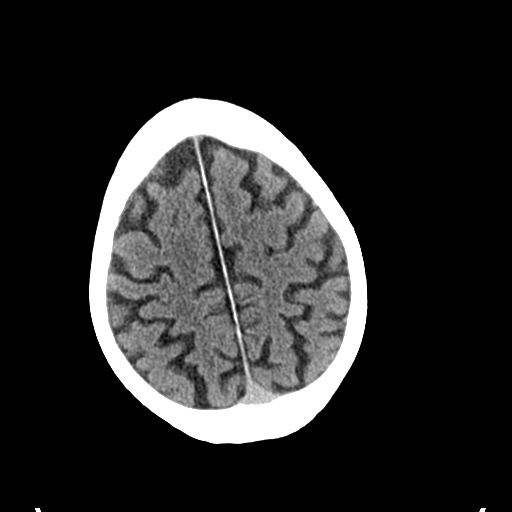
[im 25/30  brain]
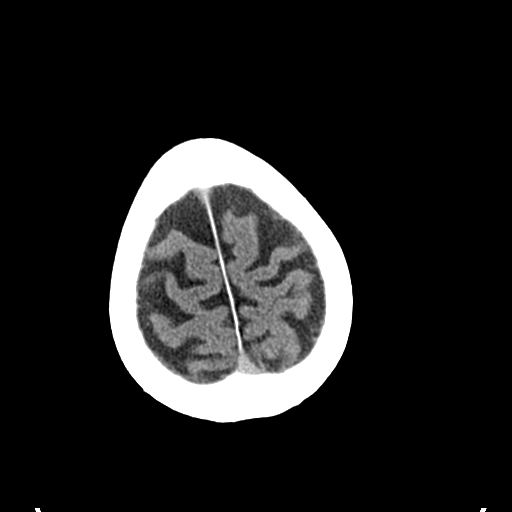
[im 28/30  brain]
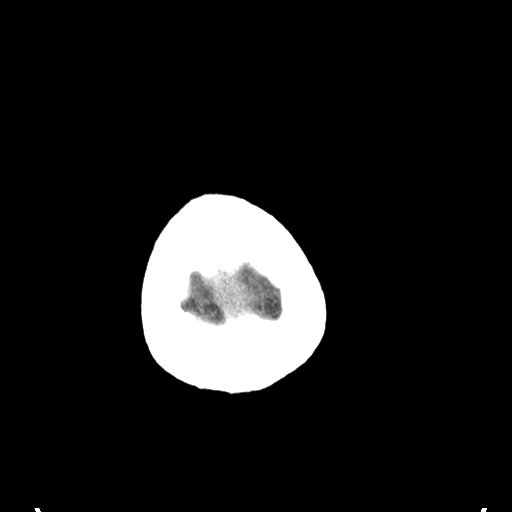
[im 28/30  bone]
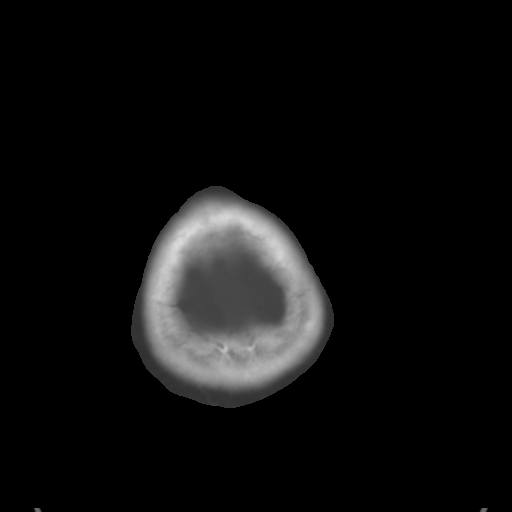

[Series 5: coronal soft tissue · coronal · 0.31mm/px · 3 of 70 slices shown]
[im 24/70  brain]
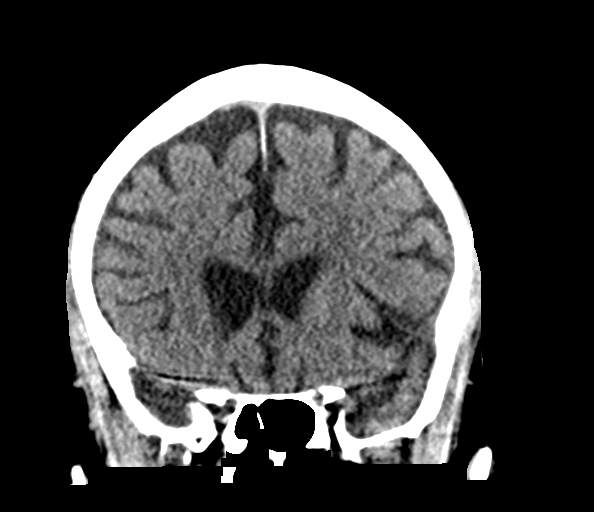
[im 31/70  brain]
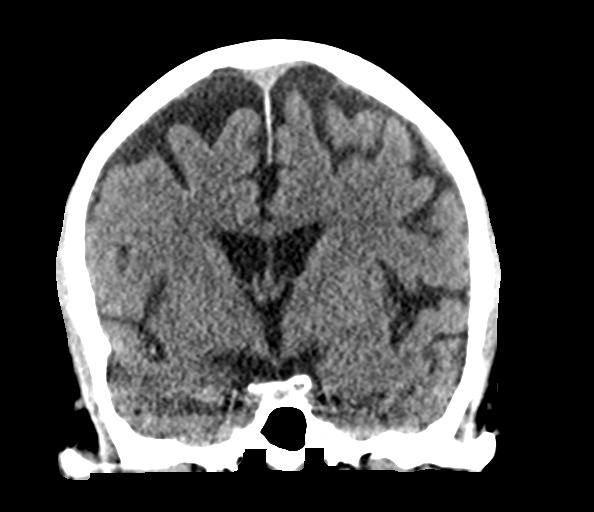
[im 39/70  brain]
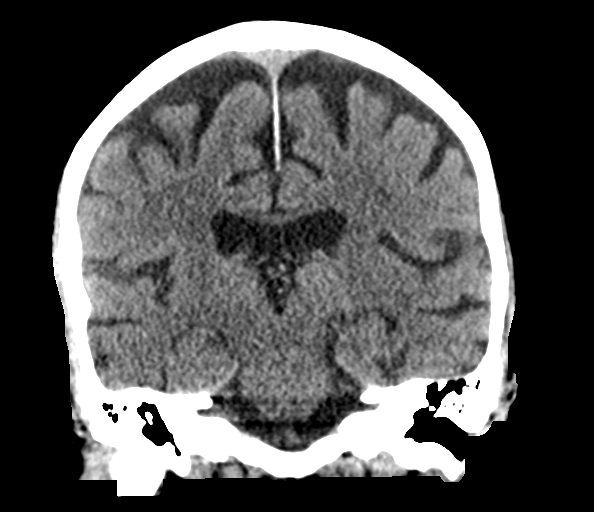

[Series 6: sagittal soft tissue · sagittal · 0.31mm/px · 3 of 61 slices shown]
[im 21/61  brain]
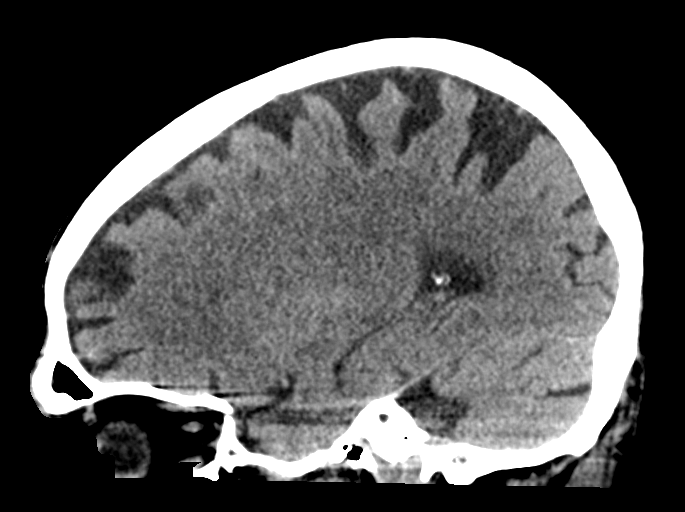
[im 31/61  brain]
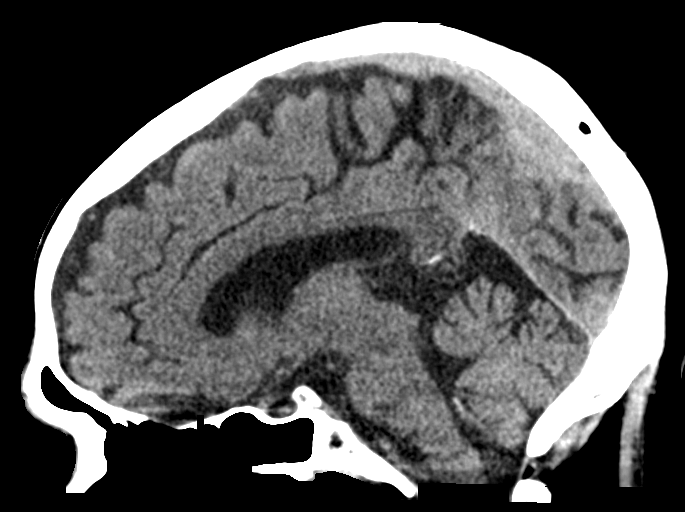
[im 41/61  brain]
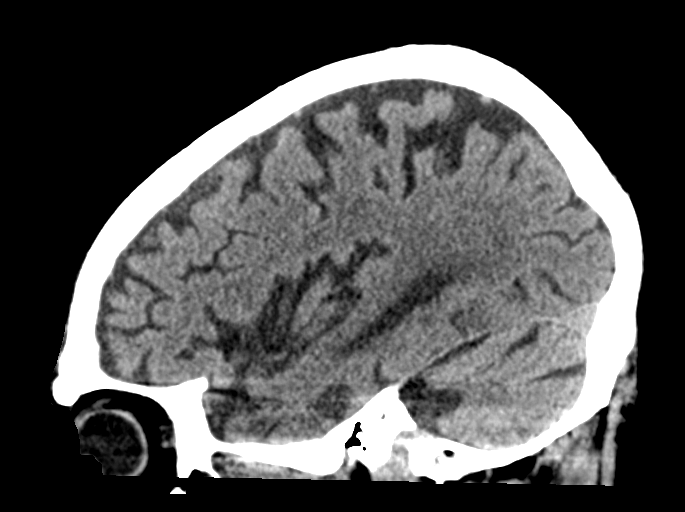

[15 of 47 positions shown; findings below may reference images not displayed]

FINDINGS: Brain: No evidence of acute intracranial hemorrhage or extra-axial
collection.No evidence of mass lesion/concern mass effect.The
ventricles are normal in size.

Vascular: No hyperdense vessel or unexpected calcification.

Skull: Normal. Negative for fracture or focal lesion.

Sinuses/Orbits: No acute finding.

Other: None.
IMPRESSION: No acute intracranial abnormality.

## 2022-06-20 IMAGING — DX DG ANKLE COMPLETE 3+V*L*
3 series · 3 of 3 positions shown · non-contrast
Comparison: None.

CLINICAL DATA: 55-year-old male status post fall yesterday with
continued pain and swelling.

EXAM:
LEFT ANKLE COMPLETE - 3+ VIEW

[ankle ap]
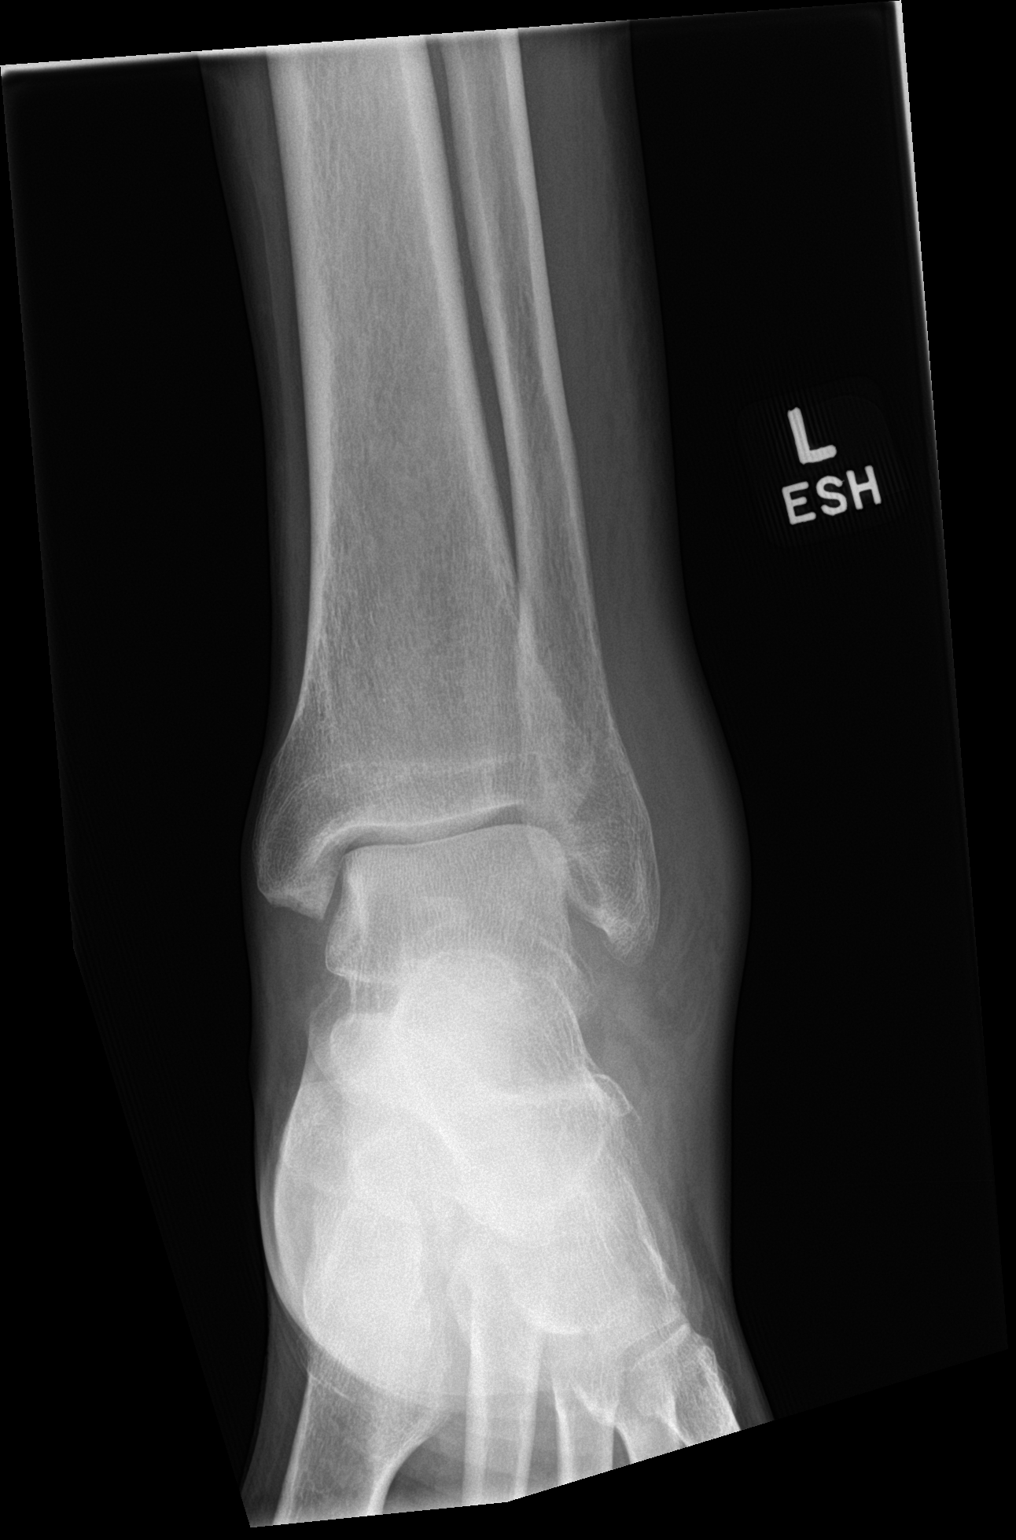

[ankle obl]
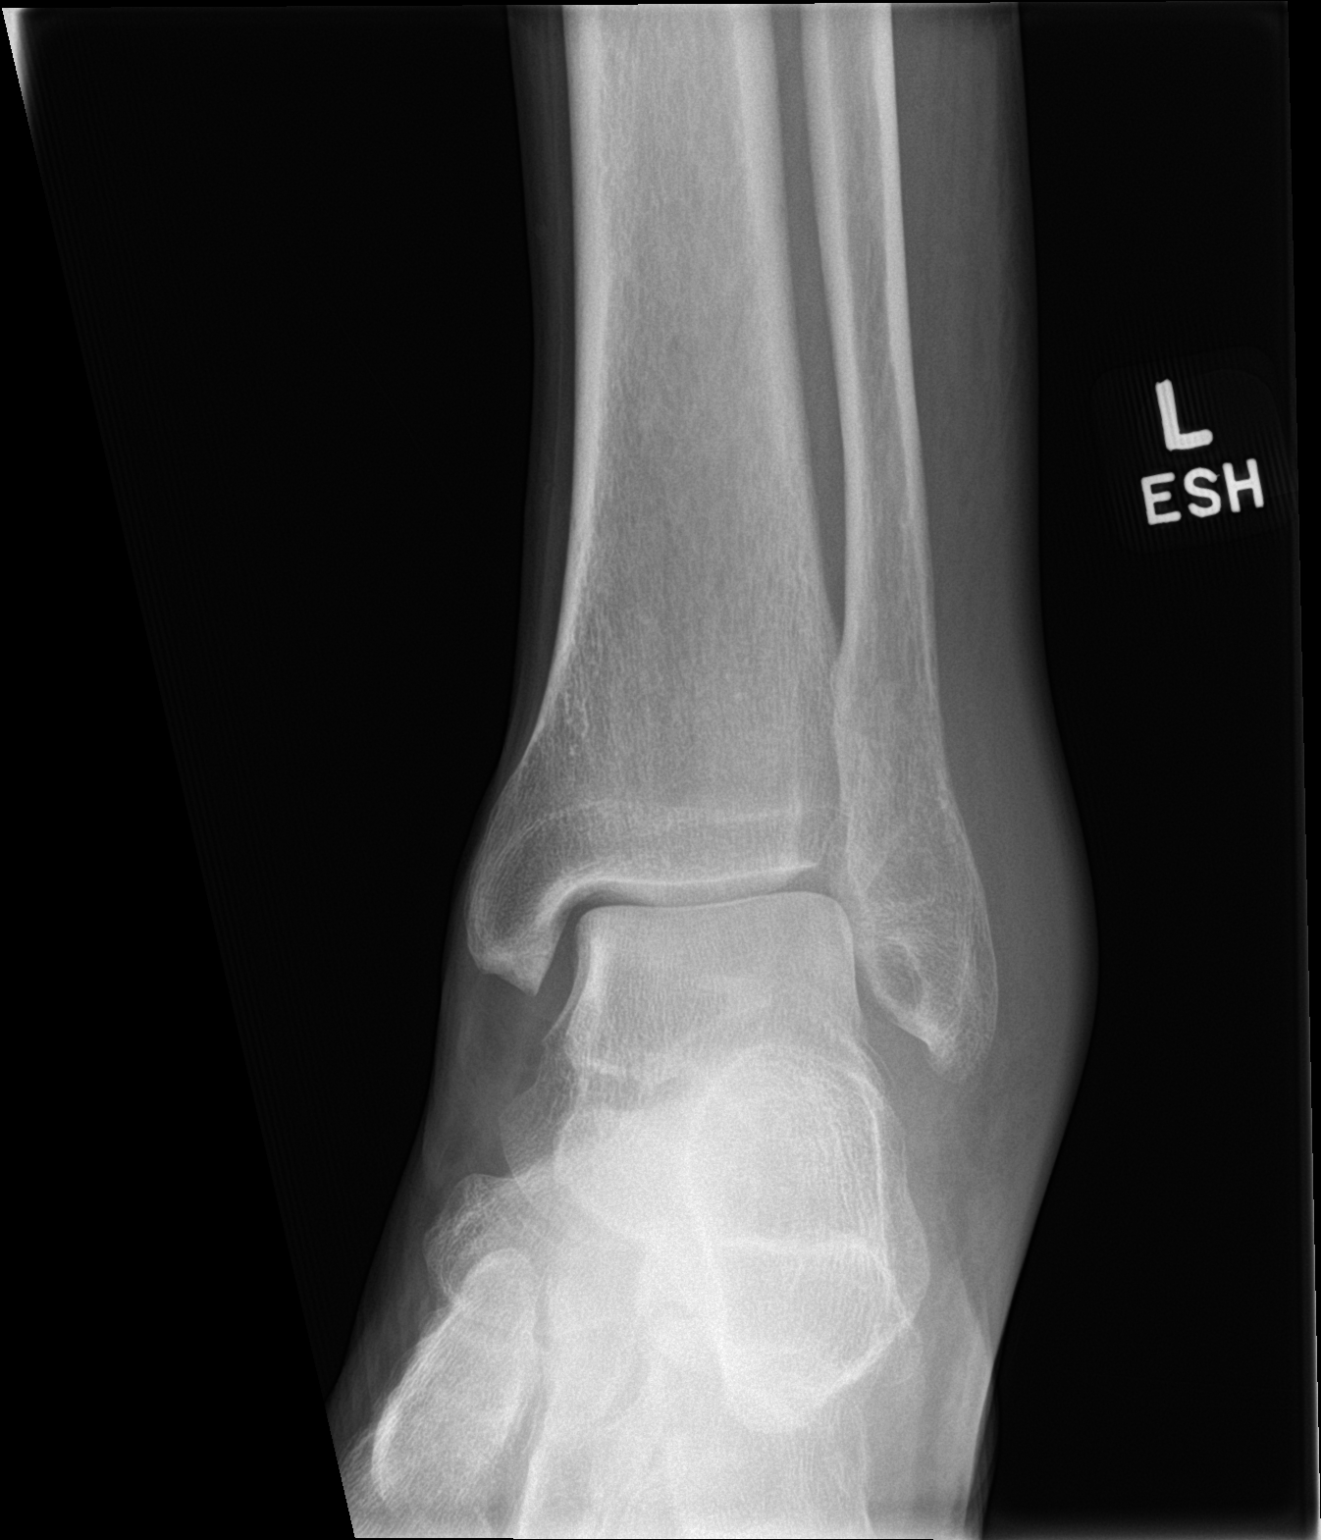

[ankle lat]
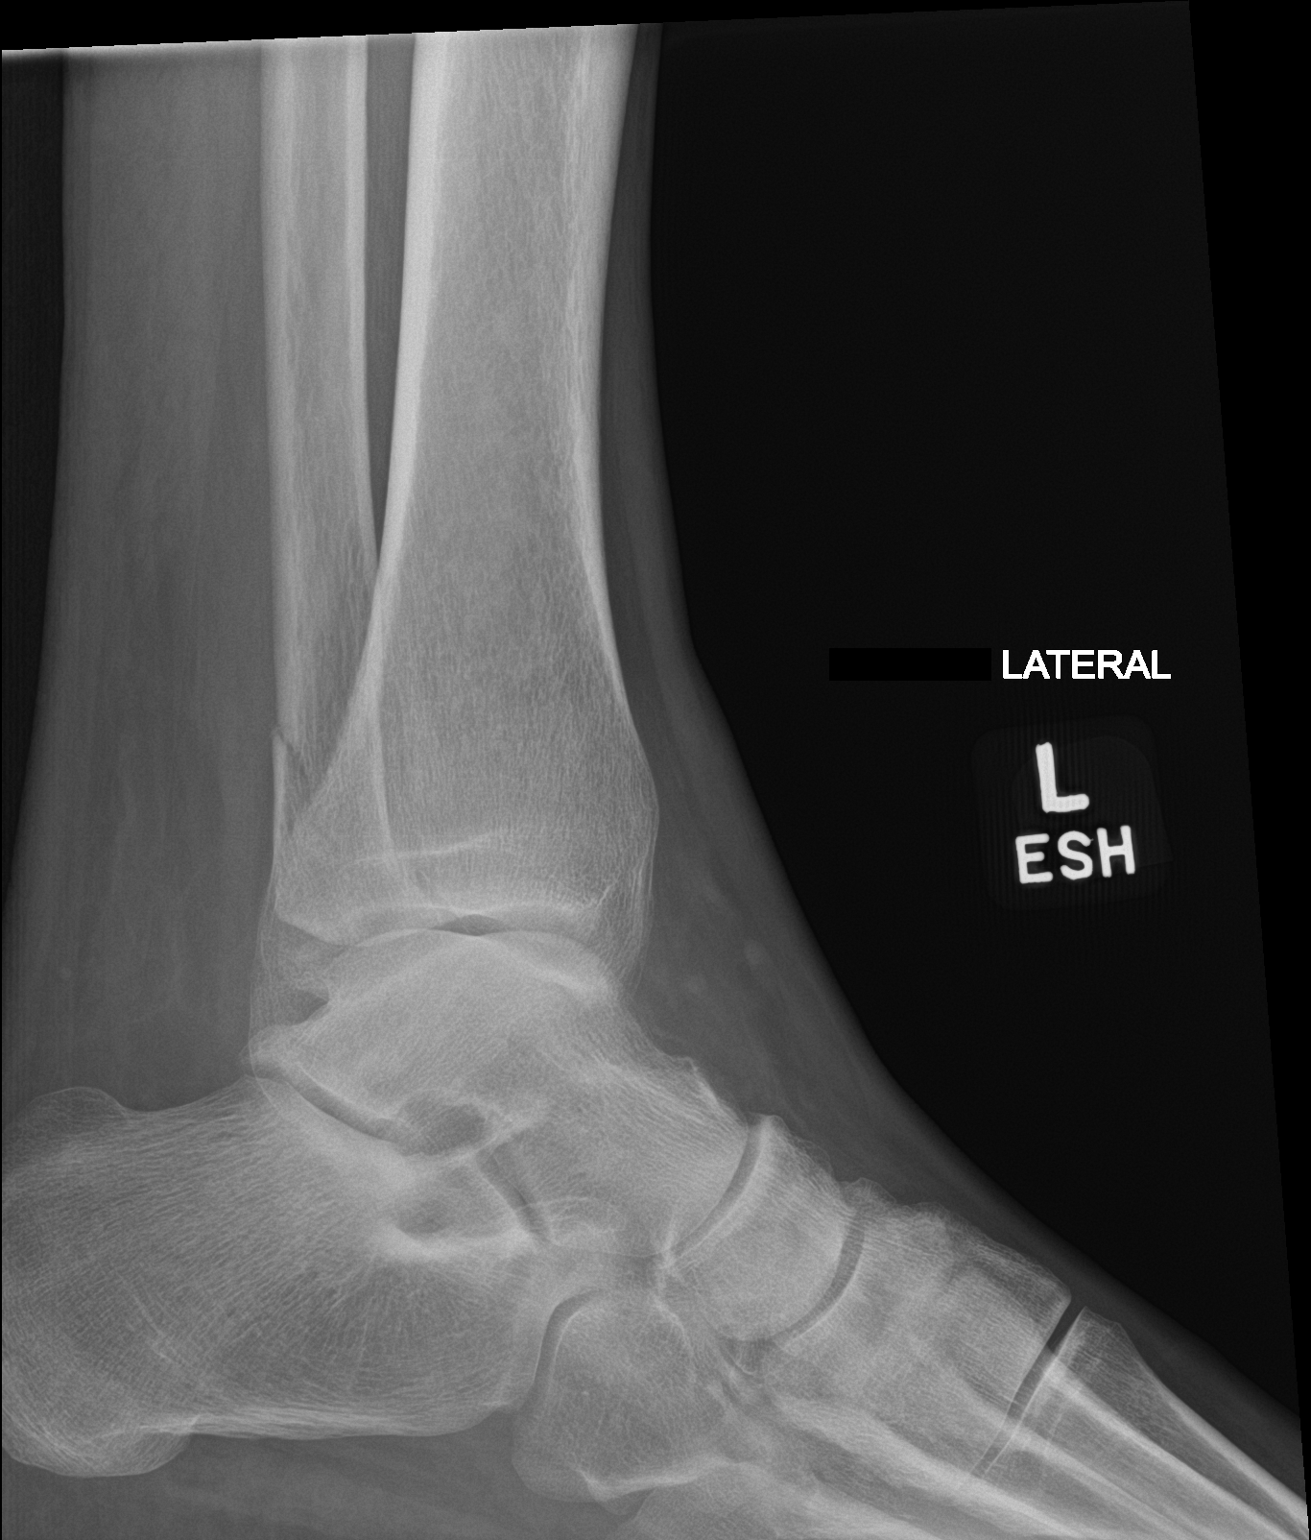

[3 of 3 positions shown; findings below may reference images not displayed]

FINDINGS: Oblique nondisplaced fracture of the distal left fibula
metadiaphysis, most visible on the cross-table lateral view.
Regional soft tissue swelling. Mortise joint alignment maintained.
Talar dome intact. Distal tibia and calcaneus appear intact.
IMPRESSION: Oblique nondisplaced fracture of the distal left fibula
metadiaphysis.
